# Patient Record
Sex: Male | Born: 1945 | Race: Asian | Hispanic: No | Marital: Married | Smoking: Never smoker
Health system: Southern US, Community
[De-identification: ages and names within clinical notes are randomized; demographics above are authoritative.]

## PROBLEM LIST (undated history)

## (undated) DIAGNOSIS — E162 Hypoglycemia, unspecified: Secondary | ICD-10-CM

## (undated) DIAGNOSIS — H409 Unspecified glaucoma: Secondary | ICD-10-CM

## (undated) DIAGNOSIS — R42 Dizziness and giddiness: Secondary | ICD-10-CM

## (undated) DIAGNOSIS — E785 Hyperlipidemia, unspecified: Secondary | ICD-10-CM

## (undated) DIAGNOSIS — R001 Bradycardia, unspecified: Secondary | ICD-10-CM

## (undated) DIAGNOSIS — I10 Essential (primary) hypertension: Secondary | ICD-10-CM

## (undated) HISTORY — PX: EYE SURGERY: SHX253

## (undated) HISTORY — PX: NO PAST SURGERIES: SHX2092

## (undated) HISTORY — DX: Hypoglycemia, unspecified: E16.2

## (undated) HISTORY — DX: Bradycardia, unspecified: R00.1

## (undated) HISTORY — DX: Dizziness and giddiness: R42

---

## 2002-01-12 ENCOUNTER — Emergency Department (HOSPITAL_COMMUNITY): Admission: EM | Admit: 2002-01-12 | Discharge: 2002-01-12 | Payer: Self-pay | Admitting: Emergency Medicine

## 2002-01-12 ENCOUNTER — Encounter: Payer: Self-pay | Admitting: Emergency Medicine

## 2008-12-19 ENCOUNTER — Emergency Department (HOSPITAL_COMMUNITY): Admission: EM | Admit: 2008-12-19 | Discharge: 2008-12-19 | Payer: Self-pay | Admitting: Emergency Medicine

## 2011-07-31 ENCOUNTER — Inpatient Hospital Stay (INDEPENDENT_AMBULATORY_CARE_PROVIDER_SITE_OTHER)
Admission: RE | Admit: 2011-07-31 | Discharge: 2011-07-31 | Disposition: A | Discharge status: Home / Self Care | Payer: Medicare Other | Source: Ambulatory Visit | Attending: Family Medicine | Admitting: Family Medicine

## 2011-07-31 DIAGNOSIS — T6391XA Toxic effect of contact with unspecified venomous animal, accidental (unintentional), initial encounter: Secondary | ICD-10-CM

## 2013-07-12 ENCOUNTER — Inpatient Hospital Stay (HOSPITAL_COMMUNITY)
Admission: EM | Admit: 2013-07-12 | Discharge: 2013-07-13 | DRG: 316 | Disposition: A | Discharge status: Home / Self Care | Payer: Medicare Other | Attending: Family Medicine | Admitting: Family Medicine

## 2013-07-12 ENCOUNTER — Encounter (HOSPITAL_COMMUNITY): Payer: Self-pay | Admitting: Emergency Medicine

## 2013-07-12 ENCOUNTER — Emergency Department (HOSPITAL_COMMUNITY): Payer: Medicare Other

## 2013-07-12 DIAGNOSIS — I10 Essential (primary) hypertension: Secondary | ICD-10-CM | POA: Diagnosis present

## 2013-07-12 DIAGNOSIS — E785 Hyperlipidemia, unspecified: Secondary | ICD-10-CM | POA: Diagnosis present

## 2013-07-12 DIAGNOSIS — R001 Bradycardia, unspecified: Secondary | ICD-10-CM | POA: Diagnosis present

## 2013-07-12 DIAGNOSIS — I959 Hypotension, unspecified: Principal | ICD-10-CM | POA: Diagnosis present

## 2013-07-12 DIAGNOSIS — H409 Unspecified glaucoma: Secondary | ICD-10-CM | POA: Diagnosis present

## 2013-07-12 DIAGNOSIS — I950 Idiopathic hypotension: Secondary | ICD-10-CM | POA: Diagnosis present

## 2013-07-12 DIAGNOSIS — I498 Other specified cardiac arrhythmias: Secondary | ICD-10-CM | POA: Diagnosis present

## 2013-07-12 HISTORY — DX: Unspecified glaucoma: H40.9

## 2013-07-12 HISTORY — DX: Hyperlipidemia, unspecified: E78.5

## 2013-07-12 HISTORY — DX: Essential (primary) hypertension: I10

## 2013-07-12 LAB — URINALYSIS, ROUTINE W REFLEX MICROSCOPIC
Glucose, UA: NEGATIVE mg/dL
Hgb urine dipstick: NEGATIVE
Leukocytes, UA: NEGATIVE
Specific Gravity, Urine: 1.018 (ref 1.005–1.030)
pH: 6 (ref 5.0–8.0)

## 2013-07-12 LAB — CBC
HCT: 32.5 % — ABNORMAL LOW (ref 39.0–52.0)
HCT: 35.2 % — ABNORMAL LOW (ref 39.0–52.0)
Hemoglobin: 10.9 g/dL — ABNORMAL LOW (ref 13.0–17.0)
MCH: 28.1 pg (ref 26.0–34.0)
MCHC: 33.5 g/dL (ref 30.0–36.0)
MCV: 83.8 fL (ref 78.0–100.0)
MCV: 83.8 fL (ref 78.0–100.0)
Platelets: 126 10*3/uL — ABNORMAL LOW (ref 150–400)
RBC: 3.88 MIL/uL — ABNORMAL LOW (ref 4.22–5.81)
RBC: 4.2 MIL/uL — ABNORMAL LOW (ref 4.22–5.81)
RDW: 12.8 % (ref 11.5–15.5)
RDW: 12.8 % (ref 11.5–15.5)
WBC: 4.2 10*3/uL (ref 4.0–10.5)
WBC: 5.3 10*3/uL (ref 4.0–10.5)

## 2013-07-12 LAB — BASIC METABOLIC PANEL
BUN: 20 mg/dL (ref 6–23)
CO2: 26 mEq/L (ref 19–32)
Calcium: 7.8 mg/dL — ABNORMAL LOW (ref 8.4–10.5)
Chloride: 108 mEq/L (ref 96–112)
Creatinine, Ser: 1.3 mg/dL (ref 0.50–1.35)
GFR calc Af Amer: 63 mL/min — ABNORMAL LOW (ref >90)
GFR calc non Af Amer: 54 mL/min — ABNORMAL LOW (ref >90)
Glucose, Bld: 149 mg/dL — ABNORMAL HIGH (ref 70–99)
Potassium: 4.3 mEq/L (ref 3.5–5.1)
Sodium: 139 mEq/L (ref 135–145)

## 2013-07-12 LAB — POCT I-STAT TROPONIN I: Troponin i, poc: 0.01 ng/mL (ref 0.00–0.08)

## 2013-07-12 LAB — CREATININE, SERUM
GFR calc Af Amer: 71 mL/min — ABNORMAL LOW (ref >90)
GFR calc non Af Amer: 61 mL/min — ABNORMAL LOW (ref >90)

## 2013-07-12 MED ORDER — SODIUM CHLORIDE 0.9 % IV BOLUS (SEPSIS)
1000.0000 mL | Freq: Once | INTRAVENOUS | Status: AC
  Administered 2013-07-12: 1000 mL via INTRAVENOUS

## 2013-07-12 MED ORDER — HEPARIN SODIUM (PORCINE) 5000 UNIT/ML IJ SOLN
5000.0000 [IU] | Freq: Three times a day (TID) | INTRAMUSCULAR | Status: DC
  Filled 2013-07-12 (×5): qty 1

## 2013-07-12 MED ORDER — SODIUM CHLORIDE 0.9 % IV BOLUS (SEPSIS)
500.0000 mL | Freq: Once | INTRAVENOUS | Status: AC
  Administered 2013-07-12: 500 mL via INTRAVENOUS

## 2013-07-12 MED ORDER — SODIUM CHLORIDE 0.9 % IJ SOLN: 3.0000 mL | Freq: Two times a day (BID) | INTRAMUSCULAR | Status: DC

## 2013-07-12 MED ORDER — ATORVASTATIN CALCIUM 40 MG PO TABS
40.0000 mg | ORAL_TABLET | ORAL | Status: DC
  Filled 2013-07-12: qty 1

## 2013-07-12 MED ORDER — SODIUM CHLORIDE 0.9 % IV SOLN
INTRAVENOUS | Status: DC
  Administered 2013-07-12: 100 mL via INTRAVENOUS

## 2013-07-12 MED ORDER — SODIUM CHLORIDE 0.9 % IV BOLUS (SEPSIS): 2000.0000 mL | Freq: Once | INTRAVENOUS | Status: DC

## 2013-07-12 NOTE — ED Notes (Signed)
Brought in by EMS from home, patient's wife states that he has been feeling dizzy off and on for the past 2 wks.  This am his BP was low 70/40 and she called medic Upon arrival EMS got a BP of 68/48

## 2013-07-12 NOTE — H&P (Signed)
FMTS Attending Admission Note: Denny Levy MD [phone] pager office [phone] I  have seen and examined this patient, reviewed their chart. I have discussed this patient with the resident. I agree with the resident's findings, assessment and care plan. His bradycardia is worrisome---especially as he seems to have little chronotropic response to his hypotensin. Interesting that he started to have symptoms about same time as he was placed on eye drops for increased intra-ocular pressure. It is possible that all of this is related to beta blockade from eye drops, but we will do complete work up to include ECHO (rule out cardiomyopathy etc), TSH, cortisol, UDS. His son has some type of heart issue--he is 51 and reports he has had MI; Apparently had some type of surgery (?a cardiac cath?)  at [facility]. I will try to get some more details on that history tomorrow although I doubt it is pertinent to his father's issues.

## 2013-07-12 NOTE — H&P (Signed)
Family Medicine Teaching [facility] Admission History and Physical Service Pager: [phone]  Patient name: Shaun Sharp Medical record number: [id number] Date of birth: [birth year] Age: 67 y.o. Gender: male  Primary Care Provider: No PCP Per Patient  Consultants: none Code Status: Full   Chief Complaint: dizziness, lightheaded  Assessment and Plan: Shaun Sharp is a 67 y.o. year old male presenting with dizziness, found to be hypotensive 70s-80s/40s-50s. PMH is significant for hypertension, hyperlipidemia, glaucoma  # Hypotension/bradycardia: symptomatic, dizziness over past ~3 months, neg orthostatics, somewhat improved w/ 500 cc bolus from 70/49 to 91/54, started when glaucoma eye drops started. Systemic side-effect vs. Cardiomyopathy vs. Cortisol deficiency vs hypothyroidism vs intrinsic cardiac origin vs adrenal insuffiencey  - sinus brady on EKG, telemetry overnight, repeat EKG in AM.  Consider consult to cards pending echo/EKG results - hold home lisinopril, brimonidine, latanoprost, call pt's ophtho office in AM to discuss eyedrops/systemic effects  - discuss possible systemic effects of drops w/optho - give 2L NS bolus, then MIVF @100 .  Will recheck BP in both arms to evaluate for differences  - echo evaluate for possible cardiomyopathy  - rpt EKG - renin/angiotensin ratio to evaluate for possible aldosterone/primary adrenal insufficiency  - am cortisol for adrenal insuffiencey as well as repeat CMET/CBC and TSH -Vitals per unit, up with assistance -UDS to evaluate for secondary causes as well as FOBT -PT/OT consults   # HLD - continue home statin  # Glaucoma: holding meds until discussion with optho  FEN/GI: Regular diet, 2L bolus then MIVF @100  Prophylaxis: SQ heparin  Disposition: pending further work-up and normotension  History of Present Illness: Shaun Sharp is a 67 y.o. year old male presenting with dizziness ongoing for several weeks found to be hypotensive and  bradycardic in the ED.  Pt states this has been ongoing for several weeks now, happening a couple of times per week, but not everyday.  Per wife, she noticed around three months ago, his BP and HR decreased.  Prior to that it was well controlled on medications around 120/80 and HR 65-70.  Due to ongoing concern for hypotension, pt and his wife decided to take his lisinopril down from 20 mg to 10 mg qd around one to two months ago.  However, for the past two days, he continued to be dizzy that was non positional.  Pt's wife does take his BP around two times per day and noted last night it was around 120/80.  Denies chest pain, chest pressure, dyspnea on exertion, edema, PND, orthopnea.  No changes to his medications three months ago other than starting bromonidine eyedrops for his glaucoma.   Pt does state that he does not see a PCP but has [facility] in [facility] past for "a general check up" and at that time started him on Lisinopril.  This visit was around two years ago, and at that time he had a stress test and was started on Crestor 20 mg qod for hyperlipidemia.    Of note, pt has had decreased PO intake for the last two years.   Pt did go from 158 to 130 lbs, unintentional but did have this decreased PO intake.  No fevers, chills, nightsweats, heat or cold intolerance, dysphagia, nausea, vomiting, diarrhea, abdominal pain, dysuria, lightheaded with valsalva, blurred vision/diplopia, tinnitus, vertigo, hearing loss.    He has noticed over the past 4-5 months changes in his urine color to more golden, but otherwise no other changes.  Pt did go [place] the [place]  about 2 weeks ago and denies any bug or animal bites, rashes, trouble at that time.  Denies smoking history, 1-2 glasses of wine per month, illicit drug use, no recent moves, renovations, or changes to the house.  Pt also reports that his 35 yo son had some sort of heart disease recently, thinks he was cathed at [facility] but unclear  why.  Review Of Systems: Per HPI   Patient Active Problem List   Diagnosis Date Noted  . 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 Bradycardia 07/12/2013  . Glaucoma 07/12/2013   Past Medical History: Past Medical History  Diagnosis Date  . Hypertension   . Glaucoma   . Hyperlipidemia    Past Surgical History: History reviewed. No pertinent past surgical history. Social History: History  Substance Use Topics  . Smoking status: Not on file  . Smokeless tobacco: Not on file  . Alcohol Use: No   Additional social history: No smoking or drug use, rare alcohol use (~1 drink/month)  Please also refer to relevant sections of EMR.  Family History: History reviewed. No pertinent family history. Allergies and Medications: Not on File No current facility-administered medications on file prior to encounter.   No current outpatient prescriptions on file prior to encounter.  brimonidine Latanoprost Lisinopril 10mg  rosuvastatin 20mg   Objective: BP 89/54  Pulse 51  Resp 13  SpO2 99% Exam: General: NAD, lying in bed under blankets and coat, looks cold HEENT: mmm, EOMI, PERRL, MMM, no scleral icterus B/L  Cardiovascular: sinus brady, no m/r/g, 2+ dp pulses, regular rhythm  Respiratory: CTAB, NWOB Abdomen: soft, nt, nd, no masses, no HSM Extremities: WWP, no LE edema Skin: no rashes Neuro: CN II-VII intact, 5/5 strength in upper and lower extremities b/l, soft touch sensation normal b/l  Labs and Imaging: CBC BMET   Recent Labs Lab 07/12/13 1357  WBC 4.2  HGB 10.9*  HCT 32.5*  PLT 126*   EKG - Bradycardia, regular rhythm  Recent Labs Lab 07/12/13 1357  NA 139  K 4.3  CL 108  CO2 26  BUN 20  CREATININE 1.30  GLUCOSE 149*  CALCIUM 7.8*     Troponin .01 UA: clean  Beverely Low, MD 07/12/2013, 5:21 PM PGY-1, [facility] Family Medicine FPTS Intern pager: [phone], text pages welcome

## 2013-07-12 NOTE — ED Provider Notes (Signed)
Medical screening examination/treatment/procedure(s) were conducted as a shared visit with non-physician practitioner(s) and myself.  I personally evaluated the patient during the encounter    67 year old male presents with hypotension bradycardia with several episodes of near syncope. On exam the patient has soft abdomen, clear heart and lung sounds, no tachycardia. He does appear mildly fatigued, mucous membranes are moist, no peripheral edema, strong peripheral pulses. Laboratory workup was overall unremarkable, EKG shows a sinus bradycardia without any other significant arrhythmia. The patient will need admission to the hospital for persistent hypotension and bradycardia for further evaluation. He has been clinically stable while in the emergency department without any decompensation despite his slight hypotension  Vida Roller, MD 07/12/13 [phone]

## 2013-07-12 NOTE — ED Provider Notes (Signed)
History    CSN: [id number] Arrival date & time 07/12/13  1324  First MD Initiated Contact with Patient 07/12/13 1326     Chief Complaint  Patient presents with  . Hypotension   (Consider location/radiation/quality/duration/timing/severity/associated sxs/prior Treatment) HPI Comments: 67 year old male with past medical history of hypertension, glaucoma and hyperlipidemia presents to the emergency department with his wife via EMS complaining of hypotension. Per wife patient has been complaining of feeling dizzy over the past 3-4 weeks. This morning patient returned home from running errands around noon, felt dizzy, sat down, symptoms continued for about 45 minutes. Wife took blood pressure and noted it to be 70/49. States similar episode occurred a few weeks back, he drank coffee and felt better. Takes 20mg  lisinopril for HTN, however cut down to 10mg  on his own since he felt he did not need 20mg . Denies any associated symptoms. Denies chest pain, sob, nausea, vomiting, peripheral edema, syncope, fever, chills, palpitations, weakness. Had a full breakfast and lunch today, normal appetite. No PCP. [facility] about 2 years back, had a stress test and everything was normal per patient.  The history is provided by the patient and the spouse.   Past Medical History  Diagnosis Date  . Hypertension   . Glaucoma   . Hyperlipidemia    History reviewed. No pertinent past surgical history. History reviewed. No pertinent family history. History  Substance Use Topics  . Smoking status: Not on file  . Smokeless tobacco: Not on file  . Alcohol Use: No    Review of Systems  Constitutional: Negative for fever, chills, diaphoresis and appetite change.  Eyes: Negative for visual disturbance.  Respiratory: Negative for shortness of breath.   Cardiovascular: Negative for chest pain.  Gastrointestinal: Negative for nausea and vomiting.  Genitourinary: Negative for difficulty  urinating.  Neurological: Positive for dizziness. Negative for syncope, weakness, numbness and headaches.  Psychiatric/Behavioral: Negative for confusion.  All other systems reviewed and are negative.    Allergies  Review of patient's allergies indicates not on file.  Home Medications  No current outpatient prescriptions on file. There were no vitals taken for this visit. Physical Exam  Nursing note and vitals reviewed. Constitutional: He is oriented to person, place, and time. He appears well-developed and well-nourished.  HENT:  Head: Normocephalic and atraumatic.  Mouth/Throat: Uvula is midline and oropharynx is clear and moist.  Eyes: Conjunctivae and EOM are normal. Pupils are equal, round, and reactive to light.  Neck: Normal range of motion. Neck supple. No JVD present.  Cardiovascular: Normal rate, regular rhythm, normal heart sounds and intact distal pulses.   No extremity edema.  Pulmonary/Chest: Effort normal and breath sounds normal. No respiratory distress. He has no wheezes. He has no rales.  Abdominal: Soft. Bowel sounds are normal. He exhibits no distension. There is no tenderness.  Musculoskeletal: Normal range of motion. He exhibits no edema.  Neurological: He is alert and oriented to person, place, and time. He has normal strength. No sensory deficit. Coordination normal.  Skin: Skin is warm and dry. He is not diaphoretic. No pallor.  Psychiatric: He has a normal mood and affect. His behavior is normal.    ED Course  Procedures (including critical care time) Labs Reviewed  CBC - Abnormal; Notable for the following:    RBC 3.88 (*)    Hemoglobin 10.9 (*)    HCT 32.5 (*)    Platelets 126 (*)    All other components within normal limits  BASIC METABOLIC PANEL - Abnormal; Notable for the following:    Glucose, Bld 149 (*)    Calcium 7.8 (*)    GFR calc non Af Amer 54 (*)    GFR calc Af Amer 63 (*)    All other components within normal limits  URINALYSIS,  ROUTINE W REFLEX MICROSCOPIC  POCT I-STAT TROPONIN I    Date: 07/12/2013  Rate: 56  Rhythm: sinus bradycardia  QRS Axis: normal  Intervals: normal  ST/T Wave abnormalities: normal  Conduction Disutrbances:none  Narrative Interpretation:   Old EKG Reviewed: none available   Dg Chest 2 View  07/12/2013   *RADIOLOGY REPORT*  Clinical Data: Hypotension, dizziness  CHEST - 2 VIEW  Comparison: None.  Findings: Cardiomediastinal silhouette is unremarkable.  Mild degenerative changes thoracic spine.  No acute infiltrate or pleural effusion.  No pulmonary edema.  IMPRESSION: No active disease.   Original Report Authenticated By: Natasha Mead, M.D.   1. Hypotension     MDM  Patient with hypotension, dizziness, non-positional. Physical exam unremarkable other than bradycardia. No neuro symptoms or focal findings. BP 85/50. Will give fluid bolus, check labs- cbc, bmp, troponin, ua, CXR. Plan to admit for symptomatic hypotension, vasovagal syncope. 3:50 PM Labs unremarkable other than Hgb of 10.9, no old to compare. EKG normal, CXR clear. Not orthostatic. Patient continues to remain hypotensive and bradycardic. He is well appearing and in NAD. He will be admitted. Case discussed with Dr. Hyacinth Meeker who also evaluated patient and agrees with plan of care. Admission accepted by [facility].  Trevor Mace, PA-C 07/12/13 [phone]

## 2013-07-13 DIAGNOSIS — I495 Sick sinus syndrome: Secondary | ICD-10-CM

## 2013-07-13 LAB — COMPREHENSIVE METABOLIC PANEL
ALT: 13 U/L (ref 0–53)
AST: 16 U/L (ref 0–37)
Alkaline Phosphatase: 65 U/L (ref 39–117)
CO2: 22 mEq/L (ref 19–32)
Calcium: 8.3 mg/dL — ABNORMAL LOW (ref 8.4–10.5)
Potassium: 3.6 mEq/L (ref 3.5–5.1)
Sodium: 140 mEq/L (ref 135–145)
Total Protein: 5.5 g/dL — ABNORMAL LOW (ref 6.0–8.3)

## 2013-07-13 LAB — CBC
MCH: 27.7 pg (ref 26.0–34.0)
MCHC: 33.2 g/dL (ref 30.0–36.0)
Platelets: 129 10*3/uL — ABNORMAL LOW (ref 150–400)
RBC: 4.15 MIL/uL — ABNORMAL LOW (ref 4.22–5.81)

## 2013-07-13 LAB — TSH: TSH: 0.833 u[IU]/mL (ref 0.350–4.500)

## 2013-07-13 NOTE — Care Management Note (Unsigned)
    Page 1 of 1   07/13/2013     2:33:51 PM   CARE MANAGEMENT NOTE 07/13/2013  Patient:  Shaun Sharp, Shaun Sharp   Account Number:  [id number]  Date Initiated:  07/13/2013  Documentation initiated by:  Sherhonda Gaspar  Subjective/Objective Assessment:   PT ADM ON 7/14 WITH HYPOTENSION, DEHYDRATION.  PTA, PT INDEPENDENT, LIVES WITH WIFE AND SON.     Action/Plan:   WILL FOLLOW FOR HOME NEEDS AS PT PROGRESSES.   Anticipated DC Date:  07/15/2013   Anticipated DC Plan:  HOME/SELF CARE      DC Planning Services  CM consult      Choice offered to / List presented to:             Status of service:  In process, will continue to follow Medicare Important Message given?   (If response is "NO", the following Medicare IM given date fields will be blank) Date Medicare IM given:   Date Additional Medicare IM given:    Discharge Disposition:    Per UR Regulation:  Reviewed for med. necessity/level of care/duration of stay  If discussed at Long Length of Stay Meetings, dates discussed:    Comments:

## 2013-07-13 NOTE — Progress Notes (Signed)
FMTS Attending Daily Note: Lue Dubuque MD [phone] pager office [phone] I  have seen and examined this patient, reviewed their chart. I have discussed this patient with the resident. I agree with the resident's findings, assessment and care plan. 

## 2013-07-13 NOTE — Progress Notes (Signed)
Pt refused evening dose of Lipitor and Heparin SQ d/t lack of education. Nurse attempted to educate the patient about the purpose of those medications, however, pt did not feel comfortable consuming. Pt also refused morning dose of heparin d/t reasons stated above. Harmon Pier

## 2013-07-13 NOTE — Progress Notes (Signed)
UR Completed.  [name] Jane [phone] 07/13/2013  

## 2013-07-13 NOTE — Evaluation (Signed)
Physical Therapy Evaluation/ Discharge Patient Details Name: Shaun Sharp MRN: [id number] DOB: 1946-03-02 Today's Date: 07/13/2013 Time: 5621-3086 PT Time Calculation (min): 19 min  PT Assessment / Plan / Recommendation History of Present Illness  Shaun Sharp is a 67 y.o. year old male presenting with dizziness, found to be hypotensive 70s-80s/40s-50s. PMH is significant for hypertension, hyperlipidemia, glaucoma  Clinical Impression  Pt without reproduction of dizziness with head turns, mobility or reaching to the floor. Pt able to visually track without deficit or nystagmus and no reproduction of symptoms with vestibular screening. Pt independent with mobility without further needs at this time. Will sign off, pt aware and agreeable.     PT Assessment  Patent does not need any further PT services    Follow Up Recommendations  No PT follow up    Does the patient have the potential to tolerate intense rehabilitation      Barriers to Discharge        Equipment Recommendations  None recommended by PT    Recommendations for Other Services     Frequency      Precautions / Restrictions Precautions Precautions: None   Pertinent Vitals/Pain No pain BP 153/69, HR 63      Mobility  Bed Mobility Bed Mobility: Supine to Sit Supine to Sit: 7: Independent;HOB flat Transfers Transfers: Sit to Stand;Stand to Sit Sit to Stand: 7: Independent;From bed Stand to Sit: 7: Independent;To chair/3-in-1 Ambulation/Gait Ambulation/Gait Assistance: 7: Independent Ambulation Distance (Feet): 350 Feet Assistive device: None Ambulation/Gait Assistance Details: including head turns, change of direction, change of speed without LOB or dizziness Gait Pattern: Within Functional Limits Gait velocity: WFL Stairs: Yes Stairs Assistance: 7: Independent Stair Management Technique: No rails Number of Stairs: 3    Exercises     PT Diagnosis:    PT Problem List:   PT Treatment Interventions:        PT Goals(Current goals can be found in the care plan section) Acute Rehab PT Goals PT Goal Formulation: No goals set, d/c therapy  Visit Information  Last PT Received On: 07/13/13 Assistance Needed: +1 History of Present Illness: Shaun Sharp is a 67 y.o. year old male presenting with dizziness, found to be hypotensive 70s-80s/40s-50s. PMH is significant for hypertension, hyperlipidemia, glaucoma       Prior Functioning  Home Living Family/patient expects to be discharged to:: Private residence Living Arrangements: Spouse/significant other Available Help at Discharge: Available 24 hours/day Type of Home: House Home Access: Stairs to enter Secretary/administrator of Steps: 1 Home Layout: One level Home Equipment: None Prior Function Level of Independence: Independent Communication Communication: No difficulties    Cognition  Cognition Arousal/Alertness: Awake/alert Behavior During Therapy: WFL for tasks assessed/performed Overall Cognitive Status: Within Functional Limits for tasks assessed    Extremity/Trunk Assessment Upper Extremity Assessment Upper Extremity Assessment: Overall WFL for tasks assessed Lower Extremity Assessment Lower Extremity Assessment: Overall WFL for tasks assessed Cervical / Trunk Assessment Cervical / Trunk Assessment: Normal   Balance    End of Session PT - End of Session Activity Tolerance: Patient tolerated treatment well Patient left: in chair;with call bell/phone within reach;with family/visitor present  GP     Delorse Lek 07/13/2013, 9:36 AM Delaney Meigs, PT [phone]

## 2013-07-13 NOTE — Progress Notes (Signed)
  Echocardiogram 2D Echocardiogram has been performed.  Arvil Chaco 07/13/2013, 8:27 AM

## 2013-07-13 NOTE — Progress Notes (Signed)
Patient is ready for discharge.  Discharge instructions reviewed with the patient and family. Follow up appointments reviewed with patient family. All voice understanding to teaching.  Patient given copies of his D/C instructions.  Ambulatory to door. Home via POV with his daughter driving.

## 2013-07-13 NOTE — Progress Notes (Signed)
Family Medicine Teaching Service Daily Progress Note Intern Pager: [phone]  Patient name: Shaun Sharp Medical record number: [id number] Date of birth: 03/20/1946 Age: 67 y.o. Gender: male  Primary Care Provider: No PCP Per Patient Consultants: None Code Status: Full  Pt Overview and Major Events to Date:   Assessment and Plan: Shaun Sharp is a 67 y.o. year old male presenting with dizziness, found to be hypotensive 70s-80s/40s-50s. PMH is significant for hypertension, hyperlipidemia, glaucoma   # Hypotension/bradycardia: symptomatic, dizziness over past ~3 months, neg orthostatics, somewhat improved w/ 500 cc bolus from 70/49 to 91/54, started when glaucoma eye drops started. Systemic side-effect vs. Cardiomyopathy vs. Cortisol deficiency vs hypothyroidism vs intrinsic cardiac origin vs adrenal insuffiencey  - sinus brady on EKG, telemetry overnight, repeat EKG in AM. Consider consult to cards pending echo/EKG results  - hold home lisinopril, brimonidine, latanoprost, call pt's ophtho office in AM to discuss eyedrops/systemic effects  - discuss possible systemic effects of drops w/optho  - give 2L NS bolus, then MIVF @100 . Will recheck BP in both arms to evaluate for differences  - echo evaluate for possible cardiomyopathy  - rpt EKG  - renin/angiotensin ratio to evaluate for possible aldosterone/primary adrenal insufficiency  - am cortisol for adrenal insuffiencey as well as repeat CMET/CBC and TSH  -Vitals per unit, up with assistance  -UDS to evaluate for secondary causes as well as FOBT  -PT/OT consults   # HLD  - continue home statin  # Glaucoma: holding meds until discussion with optho  FEN/GI: Regular diet, 2L bolus then MIVF @100   Prophylaxis: SQ heparin   Disposition: Home   Subjective: feeling good overall. No SOB, headache, lightheadedness, palpitations.   Objective: Temp:  [97.7 F (36.5 C)-98.6 F (37 C)] 97.7 F (36.5 C) (07/15 0428) Pulse Rate:  [49-66] 64  (07/15 0428) Resp:  [12-18] 18 (07/15 0428) BP: (79-123)/(48-71) 120/67 mmHg (07/15 0428) SpO2:  [97 %-100 %] 97 % (07/15 0428) Weight:  [148 lb 2.4 oz (67.2 kg)] 148 lb 2.4 oz (67.2 kg) (07/14 1813) Physical Exam: General: No acute distress. Laying in bed comfortably Cardiovascular: RRR, no murmurs Respiratory: CTAB, no wheeze Abdomen: Soft, non-tender, non-distended Extremities: No edema  Laboratory:  Recent Labs Lab 07/12/13 1357 07/12/13 1855 07/13/13 0426  WBC 4.2 5.3 4.5  HGB 10.9* 11.7* 11.5*  HCT 32.5* 35.2* 34.6*  PLT 126* 141* 129*    Recent Labs Lab 07/12/13 1357 07/12/13 1855 07/13/13 0426  NA 139  --  140  K 4.3  --  3.6  CL 108  --  111  CO2 26  --  22  BUN 20  --  16  CREATININE 1.30 1.18 1.22  CALCIUM 7.8*  --  8.3*  PROT  --   --  5.5*  BILITOT  --   --  0.3  ALKPHOS  --   --  65  ALT  --   --  13  AST  --   --  16  GLUCOSE 149*  --  112*     Jacquelin Hawking, MD 07/13/2013, 7:29 AM PGY-1, [facility] Family Medicine FPTS Intern pager: [phone], text pages welcome

## 2013-07-13 NOTE — Progress Notes (Signed)
OT Cancellation Note  Patient Details Name: Shaun Sharp MRN: [id number] DOB: 1946/08/20   Cancelled Treatment:    Reason Eval/Treat Not Completed: OT screened, no needs identified, will sign off  Marni Franzoni,HILLARY 07/13/2013, 4:41 PM

## 2013-07-15 NOTE — Discharge Summary (Signed)
Family Medicine Teaching [facility] Discharge Summary  Patient name: Shaun Sharp Medical record number: [id number] Date of birth: 1946-12-02 Age: 67 y.o. Gender: male Date of Admission: 07/12/2013  Date of Discharge: 07/13/2013  Admitting Physician: Nestor Ramp, MD  Primary Care Provider: No PCP Per Patient Consultants: None  Indication for Hospitalization: dizziness and lightheadedness  Discharge Diagnoses/Problem List:  1. Hypotension/bradycardia 2. Hyperlipidemia 3. Glaucoma  Disposition: Home  Discharge Condition: Stable  Brief Hospital Course: Shaun Sharp presenting with dizziness ongoing for several weeks found to be hypotensive and bradycardic in the ED. He stated this had been ongoing for several weeks now, happening a couple of times per week, but not everyday. Per wife, she noticed around three months ago, his BP and HR decreased. Prior to that it was well controlled on medications around 120/80 and HR 65-70. Due to ongoing concern for hypotension, pt and his wife decided to take his lisinopril down from 20 mg to 10 mg qd around one to two months ago. However, for the past two days, he continued to be dizzy that was non positional. Pt's wife does take his BP around two times per day and noted last night it was around 120/80. Denies chest pain, chest pressure, dyspnea on exertion, edema, PND, orthopnea. No changes to his medications three months ago other than starting bromonidine eyedrops for his glaucoma.  1. Hypotension/bradycardia - Shaun Sharp was placed on telemetry. EKG showed bradycardia with a regular rhythm. TSH was 0.833. Urinalysis was negative. He received 1500L NS bolus. Cortisol was negative. Renin/angiotension and cortisol pending. Over night, his blood pressure increased to 120s-150s/60s-70s. His pulse increased to 60s-70s. His symptoms of lightheadedness resolved.  2. Hyperlipidemia - we started Shaun Sharp on lipitor, however, he declined to take the meds  3.  Glaucoma - we discontinued Shaun Sharp's brimonidine and latanaprost.   Issues for Follow Up: 1. BP control 2. Restarting regimen for glaucoma 3. Results of echocardiogram  Significant Procedures: None  Significant Labs and Imaging:   Recent Labs Lab 07/12/13 1357 07/12/13 1855 07/13/13 0426  WBC 4.2 5.3 4.5  HGB 10.9* 11.7* 11.5*  HCT 32.5* 35.2* 34.6*  PLT 126* 141* 129*    Recent Labs Lab 07/12/13 1357 07/12/13 1855 07/13/13 0426  NA 139  --  140  K 4.3  --  3.6  CL 108  --  111  CO2 26  --  22  GLUCOSE 149*  --  112*  BUN 20  --  16  CREATININE 1.30 1.18 1.22  CALCIUM 7.8*  --  8.3*  ALKPHOS  --   --  65  AST  --   --  16  ALT  --   --  13  ALBUMIN  --   --  2.9*     07/12/2013 18:55  TSH 0.833    07/12/2013 14:03 07/12/2013 18:55  Troponin I  <0.30  Troponin i, poc 0.01     Outstanding Results:  Renin/angiotensin AM Cortisol  Discharge Medications:    Medication List    STOP taking these medications       brimonidine 0.2 % ophthalmic solution  Commonly known as:  ALPHAGAN     latanoprost 0.005 % ophthalmic solution  Commonly known as:  XALATAN     lisinopril 10 MG tablet  Commonly known as:  PRINIVIL,ZESTRIL      TAKE these medications       rosuvastatin 20 MG tablet  Commonly known as:  CRESTOR  Take  20 mg by mouth every other day.        Discharge Instructions: Please refer to Patient Instructions section of EMR for full details.  Patient was counseled important signs and symptoms that should prompt return to medical care, changes in medications, dietary instructions, activity restrictions, and follow up appointments.   Follow-Up Appointments:     Follow-up Information   Follow up with [facility]). (at 3:30pm to follow up your eye pressures and talk about restarting eye drops)    Contact information:   [address] [place] [address] [phone]      Call Jacquelin Hawking, MD. (Call tomorrow  morning to make an appointment for one week)    Contact information:   [address] [place] [address] [phone]       Jacquelin Hawking, MD 07/15/2013, 9:27 PM PGY-1, [facility]

## 2013-07-16 NOTE — Discharge Summary (Signed)
Family Medicine Teaching Service  Discharge Note : Attending Julitza Rickles MD Pager [phone] Office [phone] I have seen and examined this patient, reviewed their chart and discussed discharge planning wit the resident at the time of discharge. I agree with the discharge plan as above.  

## 2013-07-21 LAB — ALDOSTERONE + RENIN ACTIVITY W/ RATIO
ALDO / PRA Ratio: 1.1 Ratio (ref 0.9–28.9)
PRA LC/MS/MS: 1.79 ng/mL/h (ref 0.25–5.82)

## 2013-07-27 ENCOUNTER — Inpatient Hospital Stay: Payer: Medicare Other | Admitting: Family Medicine

## 2013-08-05 ENCOUNTER — Ambulatory Visit: Payer: Medicare Other | Admitting: Family Medicine

## 2013-08-20 ENCOUNTER — Ambulatory Visit (INDEPENDENT_AMBULATORY_CARE_PROVIDER_SITE_OTHER): Payer: Medicare Other | Admitting: Cardiovascular Disease

## 2013-08-20 ENCOUNTER — Encounter: Payer: Self-pay | Admitting: Cardiovascular Disease

## 2013-08-20 VITALS — BP 114/70 | HR 55 | Ht 63.0 in | Wt 146.4 lb

## 2013-08-20 DIAGNOSIS — R001 Bradycardia, unspecified: Secondary | ICD-10-CM

## 2013-08-20 DIAGNOSIS — I959 Hypotension, unspecified: Secondary | ICD-10-CM

## 2013-08-20 DIAGNOSIS — I950 Idiopathic hypotension: Secondary | ICD-10-CM

## 2013-08-20 DIAGNOSIS — I498 Other specified cardiac arrhythmias: Secondary | ICD-10-CM

## 2013-08-20 DIAGNOSIS — E785 Hyperlipidemia, unspecified: Secondary | ICD-10-CM

## 2013-08-20 NOTE — Assessment & Plan Note (Signed)
Patient was admitted by the family medicine teaching service in July of this year because of hypotension and bradycardia. This had been going on for several weeks. The patient was on antihypertensive medications and was taking alpha-2 blocker eyedrops for glaucoma. These were discontinued and his symptoms have markedly improved.

## 2013-08-20 NOTE — Patient Instructions (Addendum)
Follow up with Dr Berry only as needed. 

## 2013-08-20 NOTE — Assessment & Plan Note (Signed)
It was suggested that he start a statin drug however the patient declined during his hospitalization

## 2013-08-20 NOTE — Progress Notes (Signed)
   08/20/2013 Shaun Sharp   09/17/1946  [id number]  Primary Physician Shaun Garibaldi, NP Primary Cardiologist: Shaun Gess MD Shaun Sharp   HPI:  [name] is a 67 year old thin appearing married Asian male father of 4 children. I take care of his wife and all his children as well. He was self referred for bradycardia hypotension and weakness. He was initially hospitalized on the telemetry medicine teaching service in July for the symptoms. His glaucoma eye drops containing off agonists were discontinued and he feels clinically improved. He has no cardiac risk factors other than 2 hypertension and hyperlipidemia. He does not smoke. There is no family history. He denies chest pain or shortness of breath.   Current Outpatient Prescriptions  Medication Sig Dispense Refill  . lisinopril (PRINIVIL,ZESTRIL) 20 MG tablet Take 10 mg by mouth daily.      . pravastatin (PRAVACHOL) 10 MG tablet Take 10 mg by mouth daily.       No current facility-administered medications for this visit.    Not on File  History   Social History  . Marital Status: Married    Spouse Name: N/A    Number of Children: N/A  . Years of Education: N/A   Occupational History  . Not on file.   Social History Main Topics  . Smoking status: Never Smoker   . Smokeless tobacco: Not on file  . Alcohol Use: No  . Drug Use: Not on file  . Sexual Activity: Not on file   Other Topics Concern  . Not on file   Social History Narrative  . No narrative on file     Review of Systems: General: negative for chills, fever, night sweats or weight changes.  Cardiovascular: negative for chest pain, dyspnea on exertion, edema, orthopnea, palpitations, paroxysmal nocturnal dyspnea or shortness of breath Dermatological: negative for rash Respiratory: negative for cough or wheezing Urologic: negative for hematuria Abdominal: negative for nausea, vomiting, diarrhea, bright red blood per rectum, melena, or  hematemesis Neurologic: negative for visual changes, syncope, or dizziness All other systems reviewed and are otherwise negative except as noted above.    Blood pressure 114/70, pulse 55, height 5\' 3"  (1.6 m), weight 146 lb 6.4 oz (66.407 kg).  General appearance: alert and no distress Neck: no adenopathy, no carotid bruit, no JVD, supple, symmetrical, trachea midline and thyroid not enlarged, symmetric, no tenderness/mass/nodules Lungs: clear to auscultation bilaterally Heart: regular rate and rhythm, S1, S2 normal, no murmur, click, rub or gallop Abdomen: soft, non-tender; bowel sounds normal; no masses,  no organomegaly Extremities: extremities normal, atraumatic, no cyanosis or edema Pulses: 2+ and symmetric  EKG sinus bradycardia at 55 without ST or T wave changes  ASSESSMENT AND PLAN:   Idiopathic hypotension Patient was admitted by the family medicine teaching service in July of this year because of hypotension and bradycardia. This had been going on for several weeks. The patient was on antihypertensive medications and was taking alpha-2 blocker eyedrops for glaucoma. These were discontinued and his symptoms have markedly improved.  Hyperlipidemia It was suggested that he start a statin drug however the patient declined during his hospitalization      Shaun Gess MD [facility], [facility] 08/20/2013 11:39 AM

## 2013-11-04 ENCOUNTER — Other Ambulatory Visit: Payer: Self-pay

## 2017-10-06 ENCOUNTER — Telehealth: Payer: Self-pay | Admitting: Cardiovascular Disease

## 2017-10-06 NOTE — Telephone Encounter (Signed)
Received records from [facility] for appointment on 10/15/17 with Dr Allyson Sabal.  Records put with Dr Hazle Coca schedule for 10/15/17. lp

## 2017-10-15 ENCOUNTER — Ambulatory Visit (INDEPENDENT_AMBULATORY_CARE_PROVIDER_SITE_OTHER): Payer: Medicare Other | Admitting: Cardiovascular Disease

## 2017-10-15 ENCOUNTER — Encounter: Payer: Self-pay | Admitting: Cardiovascular Disease

## 2017-10-15 DIAGNOSIS — E7800 Pure hypercholesterolemia, unspecified: Secondary | ICD-10-CM

## 2017-10-15 DIAGNOSIS — R42 Dizziness and giddiness: Secondary | ICD-10-CM | POA: Diagnosis not present

## 2017-10-15 NOTE — Assessment & Plan Note (Signed)
History of hyperlipidemia on statin therapy followed by his PCP 

## 2017-10-15 NOTE — Patient Instructions (Signed)
Medication Instructions: Your physician recommends that you continue on your current medications as directed. Please refer to the Current Medication list given to you today.  Labwork: Your physician recommends that you return for lab work: TSH, Free, T4  Testing/Procedures: Your physician has recommended that you wear a 30 day event monitor. Event monitors are medical devices that record the heart's electrical activity. Doctors most often us these monitors to diagnose arrhythmias. Arrhythmias are problems with the speed or rhythm of the heartbeat. The monitor is a small, portable device. You can wear one while you do your normal daily activities. This is usually used to diagnose what is causing palpitations/syncope (passing out).  Follow-Up: Your physician recommends that you schedule a follow-up appointment in: 2 months with Dr. Allyson SabalBerry.  If you need a refill on your cardiac medications before your next appointment, please call your pharmacy.

## 2017-10-15 NOTE — Progress Notes (Signed)
10/15/2017 Shaun Sharp   [birth year]  [id number]  Primary Physician Marva PandaMillsaps, Kimberly, NP Primary Cardiologist: Runell GessJonathan J Clydine Parkison MD Roseanne RenoFACP, FACC, FAHA, FSCAI  HPI:  Sallyanne HaversBroi Sportsman is a 71 y.o. male thin appearing married Asian male father of 4 children. I take care of his wife and all his children as well. He was self referred for bradycardia hypotension and weakness. I last saw him in the office 08/20/13. He was initially hospitalized on the telemetry medicine teaching service in July for the symptoms. His glaucoma eye drops containing off agonists were discontinued and he feels clinically improved. He has no cardiac risk factors other than 2 hypertension and hyperlipidemia. He does not smoke. There is no family history. He denies chest pain or shortness of breath. Over the last several months he's had new onset episodes of dizziness/presyncope. He's had 4-5 episodes.    Current Meds  Medication Sig  . lisinopril (PRINIVIL,ZESTRIL) 20 MG tablet Take 10 mg by mouth daily.  . pravastatin (PRAVACHOL) 10 MG tablet Take 10 mg by mouth daily.  . travoprost, benzalkonium, (TRAVATAN) 0.004 % ophthalmic solution Place 1 drop into both eyes daily.     Not on File  Social History   Social History  . Marital status: Married    Spouse name: N/A  . Number of children: N/A  . Years of education: N/A   Occupational History  . Not on file.   Social History Main Topics  . Smoking status: Never Smoker  . Smokeless tobacco: Never Used  . Alcohol use No  . Drug use: Unknown  . Sexual activity: Not on file   Other Topics Concern  . Not on file   Social History Narrative  . No narrative on file     Review of Systems: General: negative for chills, fever, night sweats or weight changes.  Cardiovascular: negative for chest pain, dyspnea on exertion, edema, orthopnea, palpitations, paroxysmal nocturnal dyspnea or shortness of breath Dermatological: negative for rash Respiratory: negative for  cough or wheezing Urologic: negative for hematuria Abdominal: negative for nausea, vomiting, diarrhea, bright red blood per rectum, melena, or hematemesis Neurologic: negative for visual changes, syncope, or dizziness All other systems reviewed and are otherwise negative except as noted above.    Blood pressure (!) 118/58, pulse 60, height 5\' 5"  (1.651 m), weight 140 lb 9.6 oz (63.8 kg).  General appearance: alert and no distress Neck: no adenopathy, no carotid bruit, no JVD, supple, symmetrical, trachea midline and thyroid not enlarged, symmetric, no tenderness/mass/nodules Lungs: clear to auscultation bilaterally Heart: regular rate and rhythm, S1, S2 normal, no murmur, click, rub or gallop Extremities: extremities normal, atraumatic, no cyanosis or edema Pulses: 2+ and symmetric Skin: Skin color, texture, turgor normal. No rashes or lesions Neurologic: Alert and oriented X 3, normal strength and tone. Normal symmetric reflexes. Normal coordination and gait  EKG sinus rhythm at 60 without ST or T-wave changes. There were septal Q waves noted. I personally reviewed this EKG.  ASSESSMENT AND PLAN:   Hyperlipidemia History of hyperlipidemia on statin therapy followed by his PCP  Dizziness Mr. Alberty  was referred back for episodes of recurrent dizziness. I last saw him 4 years ago. He was having similar episodes of bradycardia and hypotension with weakness. This was thought potentially to be due to eyedrops which he was taking for glaucoma that had beta antagonists. These were discontinued and his symptoms improved. I'm going to get thyroid function tests on him and obtain a 30  day event monitor.      Runell Gess MD FACP,FACC,FAHA, [facility] 10/15/2017 12:19 PM

## 2017-10-15 NOTE — Addendum Note (Signed)
Addended by: Evans LanceSTOVER, Tayia Stonesifer W on: 10/15/2017 12:24 PM   Modules accepted: Orders

## 2017-10-15 NOTE — Addendum Note (Signed)
Addended by: Chana BodeGREEN, Michaelia Beilfuss L on: 10/15/2017 02:04 PM   Modules accepted: Orders

## 2017-10-15 NOTE — Assessment & Plan Note (Signed)
Shaun Sharp  was referred back for episodes of recurrent dizziness. I last saw him 4 years ago. He was having similar episodes of bradycardia and hypotension with weakness. This was thought potentially to be due to eyedrops which he was taking for glaucoma that had beta antagonists. These were discontinued and his symptoms improved. I'm going to get thyroid function tests on him and obtain a 30 day event monitor.

## 2017-10-16 LAB — T4, FREE: Free T4: 1.47 ng/dL (ref 0.82–1.77)

## 2017-10-16 LAB — TSH: TSH: 1.6 u[IU]/mL (ref 0.450–4.500)

## 2017-10-29 ENCOUNTER — Ambulatory Visit (INDEPENDENT_AMBULATORY_CARE_PROVIDER_SITE_OTHER): Payer: Medicare Other

## 2017-10-29 DIAGNOSIS — R42 Dizziness and giddiness: Secondary | ICD-10-CM | POA: Diagnosis not present

## 2017-12-19 ENCOUNTER — Ambulatory Visit: Payer: Medicare Other | Admitting: Cardiovascular Disease

## 2017-12-19 ENCOUNTER — Encounter: Payer: Self-pay | Admitting: Cardiovascular Disease

## 2017-12-19 DIAGNOSIS — R001 Bradycardia, unspecified: Secondary | ICD-10-CM

## 2017-12-19 NOTE — Patient Instructions (Signed)

## 2017-12-19 NOTE — Assessment & Plan Note (Signed)
Shaun Sharp returns here for follow-up of his event monitor which shows sinus rhythm, sinus bradycardia in the 50s and one episode of a heart rate of 37. Since I saw him 2 months ago he's been complaining symptomatic denying dizziness chest pain or shortness of breath.

## 2017-12-19 NOTE — Progress Notes (Signed)
[name] returns here for follow-up of his event monitor which shows sinus rhythm, sinus bradycardia in the 50s and one episode of a heart rate of 37. Since I saw him 2 months ago he's been complaining symptomatic denying dizziness chest pain or shortness of breath.  Runell GessJonathan J. Jackey Housey, M.D., FACP, [facility]ACC, Earl LagosFAHA, [facility]SC[facility] [address] [place], [place]  [id number]  [phone] 12/19/2017 10:25 AM

## 2018-10-08 ENCOUNTER — Encounter: Payer: Self-pay | Admitting: Gastroenterology

## 2018-10-08 ENCOUNTER — Ambulatory Visit: Payer: Medicare Other | Admitting: Gastroenterology

## 2018-10-08 VITALS — BP 138/64 | HR 68 | Ht 63.39 in | Wt 144.5 lb

## 2018-10-08 DIAGNOSIS — Z1212 Encounter for screening for malignant neoplasm of rectum: Secondary | ICD-10-CM

## 2018-10-08 DIAGNOSIS — Z1211 Encounter for screening for malignant neoplasm of colon: Secondary | ICD-10-CM | POA: Diagnosis not present

## 2018-10-08 DIAGNOSIS — K642 Third degree hemorrhoids: Secondary | ICD-10-CM

## 2018-10-08 NOTE — Patient Instructions (Signed)
It has been recommended to you by your physician that you have a(n) Colonoscopy and referral to a colorectal surgeon completed. Per your request, we did not schedule the procedure(s) or referral today. Please contact our office at [phone] should you decide to have the procedure completed.  Thank you for choosing me and [facility].  Venita Lick. Pleas Koch., MD., Clementeen Graham

## 2018-10-08 NOTE — Progress Notes (Signed)
History of Present Illness: This is a 72 year old male self referred for the evaluation of prolapsing hemorrhoids.  He relates difficulties with hemorrhoids for many years.  He relates prolapsing hemorrhoids that spontaneously reduced for several years however over the past year he has had to manually reduce his hemorrhoids after each bowel movement.  He is tried several hemorrhoidal creams.  He occasionally notes anal pain and small amounts of rectal bleeding.  He has not previously had colonoscopy. Denies weight loss, abdominal pain, constipation, diarrhea, change in stool caliber, melena, nausea, vomiting, dysphagia, reflux symptoms, chest pain.   No Known Allergies Outpatient Medications Prior to Visit  Medication Sig Dispense Refill  . lisinopril (PRINIVIL,ZESTRIL) 5 MG tablet Take 5 mg by mouth daily.    . pravastatin (PRAVACHOL) 10 MG tablet Take 10 mg by mouth daily.    . travoprost, benzalkonium, (TRAVATAN) 0.004 % ophthalmic solution Place 1 drop into both eyes daily.     No facility-administered medications prior to visit.    Past Medical History:  Diagnosis Date  . Bradycardia   . Glaucoma   . Hyperlipidemia   . Hypertension   . Hypoglycemia   . Lightheadedness    Past Surgical History:  Procedure Laterality Date  . NO PAST SURGERIES     Social History   Socioeconomic History  . Marital status: Married    Spouse name: Not on file  . Number of children: 4  . Years of education: Not on file  . Highest education level: Not on file  Occupational History  . Occupation: self employed  Social Needs  . Financial resource strain: Not on file  . Food insecurity:    Worry: Not on file    Inability: Not on file  . Transportation needs:    Medical: Not on file    Non-medical: Not on file  Tobacco Use  . Smoking status: Never Smoker  . Smokeless tobacco: Never Used  Substance and Sexual Activity  . Alcohol use: Yes    Comment: occasional beer once a year  . Drug  use: Not on file  . Sexual activity: Not on file  Lifestyle  . Physical activity:    Days per week: Not on file    Minutes per session: Not on file  . Stress: Not on file  Relationships  . Social connections:    Talks on phone: Not on file    Gets together: Not on file    Attends religious service: Not on file    Active member of club or organization: Not on file    Attends meetings of clubs or organizations: Not on file    Relationship status: Not on file  Other Topics Concern  . Not on file  Social History Narrative  . Not on file   Family History  Problem Relation Age of Onset  . Cataracts Mother        Review of Systems: Pertinent positive and negative review of systems were noted in the above HPI section. All other review of systems were otherwise negative.   Physical Exam: General: Well developed, well nourished, no acute distress Head: Normocephalic and atraumatic Eyes:  sclerae anicteric, EOMI Ears: Normal auditory acuity Mouth: No deformity or lesions Neck: Supple, no masses or thyromegaly Lungs: Clear throughout to auscultation Heart: Regular rate and rhythm; no murmurs, rubs or bruits Abdomen: Soft, non tender and non distended. No masses, hepatosplenomegaly or hernias noted. Normal Bowel sounds Rectal: No lesions, no tenderness,  good sphincter tone, good squeeze, Hemoccult negative stool Musculoskeletal: Symmetrical with no gross deformities  Skin: No lesions on visible extremities Pulses:  Normal pulses noted Extremities: No clubbing, cyanosis, edema or deformities noted Neurological: Alert oriented x 4, grossly nonfocal Cervical Nodes:  No significant cervical adenopathy Inguinal Nodes: No significant inguinal adenopathy Psychological:  Alert and cooperative. Normal mood and affect   Assessment and Recommendations:  1.  Suspected grade 3 internal hemorrhoids.  Recommended proceeding with colonoscopy for further evaluation of his symptoms and for  colorectal cancer screening.  Recommended surgical referral for further evaluation and management of suspected hemorrhoids as he is not any ideal candidate for banding.  He had a number of questions and I addressed them to his satisfaction.  He states he will consider both of these recommendations and contact us if he wishes to proceed.  GI follow-up PRN.  Ongoing follow-up with his PCP.   cc: Nadyne Coombes, MD

## 2019-01-01 DIAGNOSIS — Z23 Encounter for immunization: Secondary | ICD-10-CM | POA: Diagnosis not present

## 2019-01-04 DIAGNOSIS — I10 Essential (primary) hypertension: Secondary | ICD-10-CM | POA: Diagnosis not present

## 2019-01-04 DIAGNOSIS — J101 Influenza due to other identified influenza virus with other respiratory manifestations: Secondary | ICD-10-CM | POA: Diagnosis not present

## 2019-03-26 DIAGNOSIS — I491 Atrial premature depolarization: Secondary | ICD-10-CM | POA: Diagnosis not present

## 2019-03-26 DIAGNOSIS — Z125 Encounter for screening for malignant neoplasm of prostate: Secondary | ICD-10-CM | POA: Diagnosis not present

## 2019-03-26 DIAGNOSIS — E119 Type 2 diabetes mellitus without complications: Secondary | ICD-10-CM | POA: Diagnosis not present

## 2019-03-26 DIAGNOSIS — E785 Hyperlipidemia, unspecified: Secondary | ICD-10-CM | POA: Diagnosis not present

## 2019-03-26 DIAGNOSIS — Z0000 Encounter for general adult medical examination without abnormal findings: Secondary | ICD-10-CM | POA: Diagnosis not present

## 2019-03-26 DIAGNOSIS — R05 Cough: Secondary | ICD-10-CM | POA: Diagnosis not present

## 2019-03-26 DIAGNOSIS — I10 Essential (primary) hypertension: Secondary | ICD-10-CM | POA: Diagnosis not present

## 2019-03-26 DIAGNOSIS — M519 Unspecified thoracic, thoracolumbar and lumbosacral intervertebral disc disorder: Secondary | ICD-10-CM | POA: Diagnosis not present

## 2019-05-11 ENCOUNTER — Telehealth: Payer: Self-pay | Admitting: Cardiovascular Disease

## 2019-05-13 ENCOUNTER — Telehealth (INDEPENDENT_AMBULATORY_CARE_PROVIDER_SITE_OTHER): Payer: PPO | Admitting: Cardiovascular Disease

## 2019-05-13 DIAGNOSIS — E782 Mixed hyperlipidemia: Secondary | ICD-10-CM

## 2019-05-13 NOTE — Patient Instructions (Signed)
Medication Instructions:   Your physician recommends that you continue on your current medications as directed. Please refer to the Current Medication list given to you today. If you need a refill on your cardiac medications before your next appointment, please call your pharmacy.   Lab work:  NONE  Testing/Procedures:  NONE  Follow-Up: Your physician recommends that you schedule a follow-up appointment in: 1 year. You will receive a reminder letter in the mail in about 10 months reminding you to call and schedule your appointment. If you don't receive this letter, please contact our office.  Any Other Special Instructions Will Be Listed Below (If Applicable).

## 2019-05-13 NOTE — Progress Notes (Signed)
Virtual Visit via Video Note   This visit type was conducted due to national recommendations for restrictions regarding the COVID-19 Pandemic (e.g. social distancing) in an effort to limit this patient's exposure and mitigate transmission in our community.  Due to his co-morbid illnesses, this patient is at least at moderate risk for complications without adequate follow up.  This format is felt to be most appropriate for this patient at this time.  All issues noted in this document were discussed and addressed.  A limited physical exam was performed with this format.  Please refer to the patient's chart for his consent to telehealth for [facility].   Date:  05/13/2019   ID:  Shaun, Sharp 01/01/1946, MRN [id number]  Patient Location: Home Provider Location: Home  PCP:  Dois Davenport, MD  Cardiologist:  Nanetta Batty, MD  Electrophysiologist:  None   Evaluation Performed:  Follow-Up Visit  Chief Complaint: 1 year follow-up hypertension hyperlipidemia  History of Present Illness:    Shaun Sharp is a 73 y.o. male thin appearing married Asian male father of 4 children. I take care of his wife and all his children as well. He was self referred for bradycardia hypotension and weakness. I last saw him in the office 12/19/2017. He was initially hospitalized on the telemetry medicine teaching service in July for the symptoms. His glaucoma eye drops containing off agonists were discontinued and he feels clinically improved. He has no cardiac risk factors other than 2 hypertension and hyperlipidemia. He does not smoke. There is no family history.  Since I saw him a year and a half ago he is remained stable.  He denies chest pain or shortness of breath.  The patient does not have symptoms concerning for COVID-19 infection (fever, chills, cough, or new shortness of breath).    Past Medical History:  Diagnosis Date  . Bradycardia   . Glaucoma   . Hyperlipidemia   . Hypertension   .  Hypoglycemia   . Lightheadedness    Past Surgical History:  Procedure Laterality Date  . NO PAST SURGERIES       Current Meds  Medication Sig  . lisinopril (PRINIVIL,ZESTRIL) 5 MG tablet Take 5 mg by mouth daily.  . pravastatin (PRAVACHOL) 10 MG tablet Take 10 mg by mouth daily.     Allergies:   Patient has no known allergies.   Social History   Tobacco Use  . Smoking status: Never Smoker  . Smokeless tobacco: Never Used  Substance Use Topics  . Alcohol use: Yes    Comment: occasional beer once a year  . Drug use: Not on file     Family Hx: The patient's family history includes Cataracts in his mother.  ROS:   Please see the history of present illness.     All other systems reviewed and are negative.   Prior CV studies:   The following studies were reviewed today:  None  Labs/Other Tests and Data Reviewed:    EKG:  No ECG reviewed.  Recent Labs: No results found for requested labs within last 8760 hours.   Recent Lipid Panel No results found for: CHOL, TRIG, HDL, CHOLHDL, LDLCALC, LDLDIRECT  Wt Readings from Last 3 Encounters:  05/13/19 139 lb (63 kg)  10/08/18 144 lb 8 oz (65.5 kg)  12/19/17 141 lb 6.4 oz (64.1 kg)     Objective:    Vital Signs:  BP 125/69   Pulse 68   Ht 5\' 3"  (1.6 m)  Wt 139 lb (63 kg)   SpO2 98%   BMI 24.62 kg/m    VITAL SIGNS:  reviewed GEN:  no acute distress RESPIRATORY:  normal respiratory effort, symmetric expansion NEURO:  alert and oriented x 3, no obvious focal deficit PSYCH:  normal affect  ASSESSMENT & PLAN:    1. Hyperlipidemia- history of hyperlipidemia on pravastatin with recent lipid profile performed 3/20 revealed by his PCP revealing total cholesterol 196, LDL of 106.  COVID-19 Education: The signs and symptoms of COVID-19 were discussed with the patient and how to seek care for testing (follow up with PCP or arrange E-visit).  The importance of social distancing was discussed today.  Time:   Today,  I have spent 5 minutes with the patient with telehealth technology discussing the above problems.     Medication Adjustments/Labs and Tests Ordered: Current medicines are reviewed at length with the patient today.  Concerns regarding medicines are outlined above.   Tests Ordered: No orders of the defined types were placed in this encounter.   Medication Changes: No orders of the defined types were placed in this encounter.   Disposition:  Follow up in 1 year(s)  Signed, Nanetta BattyJonathan Jacoba Cherney, MD  05/13/2019 9:53 AM     [facility]

## 2019-06-29 NOTE — Telephone Encounter (Signed)
Opened in error

## 2019-07-06 DIAGNOSIS — E785 Hyperlipidemia, unspecified: Secondary | ICD-10-CM | POA: Diagnosis not present

## 2019-07-06 DIAGNOSIS — E119 Type 2 diabetes mellitus without complications: Secondary | ICD-10-CM | POA: Diagnosis not present

## 2019-07-06 DIAGNOSIS — I10 Essential (primary) hypertension: Secondary | ICD-10-CM | POA: Diagnosis not present

## 2019-07-06 DIAGNOSIS — R100 Acute abdomen: Secondary | ICD-10-CM | POA: Diagnosis not present

## 2019-07-06 DIAGNOSIS — R1030 Lower abdominal pain, unspecified: Secondary | ICD-10-CM | POA: Diagnosis not present

## 2019-07-06 DIAGNOSIS — J309 Allergic rhinitis, unspecified: Secondary | ICD-10-CM | POA: Diagnosis not present

## 2019-07-09 DIAGNOSIS — J309 Allergic rhinitis, unspecified: Secondary | ICD-10-CM | POA: Diagnosis not present

## 2019-07-09 DIAGNOSIS — R100 Acute abdomen: Secondary | ICD-10-CM | POA: Diagnosis not present

## 2019-07-09 DIAGNOSIS — R1030 Lower abdominal pain, unspecified: Secondary | ICD-10-CM | POA: Diagnosis not present

## 2019-07-09 DIAGNOSIS — I10 Essential (primary) hypertension: Secondary | ICD-10-CM | POA: Diagnosis not present

## 2019-08-19 ENCOUNTER — Other Ambulatory Visit: Payer: Self-pay | Admitting: Family Medicine

## 2019-08-19 ENCOUNTER — Other Ambulatory Visit: Payer: Self-pay

## 2019-08-19 ENCOUNTER — Ambulatory Visit
Admission: RE | Admit: 2019-08-19 | Discharge: 2019-08-19 | Disposition: A | Discharge status: Home / Self Care | Payer: PPO | Source: Ambulatory Visit | Attending: Family Medicine | Admitting: Family Medicine

## 2019-08-19 DIAGNOSIS — S0285XA Fracture of orbit, unspecified, initial encounter for closed fracture: Secondary | ICD-10-CM

## 2019-08-19 DIAGNOSIS — J309 Allergic rhinitis, unspecified: Secondary | ICD-10-CM | POA: Diagnosis not present

## 2019-08-19 DIAGNOSIS — H5712 Ocular pain, left eye: Secondary | ICD-10-CM | POA: Diagnosis not present

## 2019-08-19 DIAGNOSIS — M7989 Other specified soft tissue disorders: Secondary | ICD-10-CM | POA: Diagnosis not present

## 2019-08-19 DIAGNOSIS — S0990XA Unspecified injury of head, initial encounter: Secondary | ICD-10-CM | POA: Diagnosis not present

## 2019-08-19 DIAGNOSIS — R100 Acute abdomen: Secondary | ICD-10-CM | POA: Diagnosis not present

## 2019-08-19 DIAGNOSIS — I10 Essential (primary) hypertension: Secondary | ICD-10-CM | POA: Diagnosis not present

## 2019-08-20 DIAGNOSIS — J309 Allergic rhinitis, unspecified: Secondary | ICD-10-CM | POA: Diagnosis not present

## 2019-08-20 DIAGNOSIS — S0990XA Unspecified injury of head, initial encounter: Secondary | ICD-10-CM | POA: Diagnosis not present

## 2019-08-20 DIAGNOSIS — L249 Irritant contact dermatitis, unspecified cause: Secondary | ICD-10-CM | POA: Diagnosis not present

## 2019-08-20 DIAGNOSIS — I10 Essential (primary) hypertension: Secondary | ICD-10-CM | POA: Diagnosis not present

## 2019-08-30 DIAGNOSIS — Z23 Encounter for immunization: Secondary | ICD-10-CM | POA: Diagnosis not present

## 2019-08-30 DIAGNOSIS — J309 Allergic rhinitis, unspecified: Secondary | ICD-10-CM | POA: Diagnosis not present

## 2019-08-30 DIAGNOSIS — I10 Essential (primary) hypertension: Secondary | ICD-10-CM | POA: Diagnosis not present

## 2019-08-30 DIAGNOSIS — J111 Influenza due to unidentified influenza virus with other respiratory manifestations: Secondary | ICD-10-CM | POA: Diagnosis not present

## 2019-09-28 ENCOUNTER — Ambulatory Visit: Payer: PPO | Admitting: Cardiovascular Disease

## 2019-12-13 DIAGNOSIS — E785 Hyperlipidemia, unspecified: Secondary | ICD-10-CM | POA: Diagnosis not present

## 2019-12-13 DIAGNOSIS — J111 Influenza due to unidentified influenza virus with other respiratory manifestations: Secondary | ICD-10-CM | POA: Diagnosis not present

## 2019-12-13 DIAGNOSIS — E559 Vitamin D deficiency, unspecified: Secondary | ICD-10-CM | POA: Diagnosis not present

## 2019-12-13 DIAGNOSIS — I10 Essential (primary) hypertension: Secondary | ICD-10-CM | POA: Diagnosis not present

## 2019-12-13 DIAGNOSIS — E119 Type 2 diabetes mellitus without complications: Secondary | ICD-10-CM | POA: Diagnosis not present

## 2019-12-13 DIAGNOSIS — J309 Allergic rhinitis, unspecified: Secondary | ICD-10-CM | POA: Diagnosis not present

## 2019-12-13 DIAGNOSIS — Z0183 Encounter for blood typing: Secondary | ICD-10-CM | POA: Diagnosis not present

## 2020-05-17 ENCOUNTER — Ambulatory Visit: Payer: PPO | Admitting: Cardiovascular Disease

## 2020-05-26 ENCOUNTER — Ambulatory Visit (INDEPENDENT_AMBULATORY_CARE_PROVIDER_SITE_OTHER): Payer: Medicare Other | Admitting: Cardiovascular Disease

## 2020-05-26 ENCOUNTER — Other Ambulatory Visit: Payer: Self-pay

## 2020-05-26 ENCOUNTER — Encounter: Payer: Self-pay | Admitting: Cardiovascular Disease

## 2020-05-26 VITALS — BP 92/52 | HR 60 | Ht 65.0 in | Wt 138.0 lb

## 2020-05-26 DIAGNOSIS — E782 Mixed hyperlipidemia: Secondary | ICD-10-CM | POA: Diagnosis not present

## 2020-05-26 NOTE — Patient Instructions (Signed)
Medication Instructions:  No changes *If you need a refill on your cardiac medications before your next appointment, please call your pharmacy*   Lab Work: None ordered If you have labs (blood work) drawn today and your tests are completely normal, you will receive your results only by: . MyChart Message (if you have MyChart) OR . A paper copy in the mail If you have any lab test that is abnormal or we need to change your treatment, we will call you to review the results.   Testing/Procedures: None ordered   Follow-Up: At [facility], you and your health needs are our priority.  As part of our continuing mission to provide you with exceptional heart care, we have created designated Provider Care Teams.  These Care Teams include your primary Cardiologist (physician) and Advanced Practice Providers (APPs -  Physician Assistants and Nurse Practitioners) who all work together to provide you with the care you need, when you need it.  We recommend signing up for the patient portal called "MyChart".  Sign up information is provided on this After Visit Summary.  MyChart is used to connect with patients for Virtual Visits (Telemedicine).  Patients are able to view lab/test results, encounter notes, upcoming appointments, etc.  Non-urgent messages can be sent to your provider as well.   To learn more about what you can do with MyChart, go to [email or web address].    Your next appointment:   Follow up as needed with Dr. Berry 

## 2020-05-26 NOTE — Progress Notes (Signed)
05/26/2020 Shaun Sharp   1946/11/05  [id number]  Primary Physician Shaun Davenport, MD Primary Cardiologist: Shaun Gess MD Shaun Sharp, [place]  HPI:  Shaun Sharp is a 74 y.o.  thin appearing married Asian male father of 4 children. I take care of his wife and all his children as well. He was self referred for bradycardia hypotension and weakness.I last saw him for a virtual telemedicine video visit 05/13/2019. He was initially hospitalized on the telemetry medicine teaching service in July for the symptoms. His glaucoma eye drops containing off agonists were discontinued and he feels clinically improved. He has no cardiac risk factors other than 2 hypertension and hyperlipidemia. He does not smoke. There is no family history.  Since I saw him a year ago he is remained stable.  He denies chest pain or shortness of breath.   Current Meds  Medication Sig  . ASPIRIN 81 PO Take 81 mg by mouth daily.  Marland Kitchen lisinopril (PRINIVIL,ZESTRIL) 5 MG tablet Take 5 mg by mouth daily.  . pravastatin (PRAVACHOL) 10 MG tablet Take 10 mg by mouth daily.     No Known Allergies  Social History   Socioeconomic History  . Marital status: Married    Spouse name: Not on file  . Number of children: 4  . Years of education: Not on file  . Highest education level: Not on file  Occupational History  . Occupation: self employed  Tobacco Use  . Smoking status: Never Smoker  . Smokeless tobacco: Never Used  Substance and Sexual Activity  . Alcohol use: Yes    Comment: occasional beer once a year  . Drug use: Not on file  . Sexual activity: Not on file  Other Topics Concern  . Not on file  Social History Narrative  . Not on file   Social Determinants of Health   Financial Resource Strain:   . Difficulty of Paying Living Expenses:   Food Insecurity:   . Worried About Programme researcher, broadcasting/film/video in the Last Year:   . Barista in the Last Year:   Transportation Needs:   . Automotive engineer (Medical):   Marland Kitchen Lack of Transportation (Non-Medical):   Physical Activity:   . Days of Exercise per Week:   . Minutes of Exercise per Session:   Stress:   . Feeling of Stress :   Social Connections:   . Frequency of Communication with Friends and Family:   . Frequency of Social Gatherings with Friends and Family:   . Attends Religious Services:   . Active Member of Clubs or Organizations:   . Attends Banker Meetings:   Marland Kitchen Marital Status:   Intimate Partner Violence:   . Fear of Current or Ex-Partner:   . Emotionally Abused:   Marland Kitchen Physically Abused:   . Sexually Abused:      Review of Systems: General: negative for chills, fever, night sweats or weight changes.  Cardiovascular: negative for chest pain, dyspnea on exertion, edema, orthopnea, palpitations, paroxysmal nocturnal dyspnea or shortness of breath Dermatological: negative for rash Respiratory: negative for cough or wheezing Urologic: negative for hematuria Abdominal: negative for nausea, vomiting, diarrhea, bright red blood per rectum, melena, or hematemesis Neurologic: negative for visual changes, syncope, or dizziness All other systems reviewed and are otherwise negative except as noted above.    Blood pressure (!) 92/52, pulse 60, height 5\' 5"  (1.651 m), weight 138 lb (62.6 kg).  General appearance:  alert and no distress Neck: no adenopathy, no carotid bruit, no JVD, supple, symmetrical, trachea midline and thyroid not enlarged, symmetric, no tenderness/mass/nodules Lungs: clear to auscultation bilaterally Heart: regular rate and rhythm, S1, S2 normal, no murmur, click, rub or gallop Extremities: extremities normal, atraumatic, no cyanosis or edema Pulses: 2+ and symmetric Skin: Skin color, texture, turgor normal. No rashes or lesions Neurologic: Alert and oriented X 3, normal strength and tone. Normal symmetric reflexes. Normal coordination and gait  EKG sinus rhythm at 60 without ST or T  wave changes.  There were septal Q waves.  I personally reviewed this EKG.  ASSESSMENT AND PLAN:   Hyperlipidemia History of hyperlipidemia on statin therapy followed by his PCP      Shaun Harp MD [facility], [facility] 05/26/2020 10:59 AM

## 2020-05-26 NOTE — Assessment & Plan Note (Signed)
History of hyperlipidemia on statin therapy followed by his PCP 

## 2020-06-15 ENCOUNTER — Telehealth: Payer: Self-pay | Admitting: Physician Assistant

## 2020-06-15 NOTE — Telephone Encounter (Signed)
Was a referral: wife called to cancel appointment for 6/23. Didn't reschedule

## 2020-06-21 ENCOUNTER — Ambulatory Visit: Payer: PPO | Admitting: Physician Assistant

## 2020-08-10 ENCOUNTER — Other Ambulatory Visit: Payer: Self-pay | Admitting: Family Medicine

## 2020-08-10 DIAGNOSIS — R1011 Right upper quadrant pain: Secondary | ICD-10-CM

## 2020-08-18 ENCOUNTER — Other Ambulatory Visit: Payer: Medicare Other

## 2020-08-25 ENCOUNTER — Ambulatory Visit
Admission: RE | Admit: 2020-08-25 | Discharge: 2020-08-25 | Disposition: A | Discharge status: Home / Self Care | Payer: Medicare Other | Source: Ambulatory Visit | Attending: Family Medicine | Admitting: Family Medicine

## 2020-08-25 DIAGNOSIS — R1011 Right upper quadrant pain: Secondary | ICD-10-CM

## 2020-09-11 ENCOUNTER — Ambulatory Visit: Payer: Medicare Other | Admitting: Cardiovascular Disease

## 2020-09-11 ENCOUNTER — Encounter: Payer: Self-pay | Admitting: Cardiovascular Disease

## 2020-09-11 VITALS — BP 94/58 | HR 61 | Ht 65.0 in | Wt 138.0 lb

## 2020-09-11 DIAGNOSIS — R42 Dizziness and giddiness: Secondary | ICD-10-CM | POA: Diagnosis not present

## 2020-09-11 DIAGNOSIS — E782 Mixed hyperlipidemia: Secondary | ICD-10-CM | POA: Diagnosis not present

## 2020-09-11 DIAGNOSIS — I950 Idiopathic hypotension: Secondary | ICD-10-CM

## 2020-09-11 NOTE — Assessment & Plan Note (Signed)
History of dizziness in the past.  He is recently done in [place] with his wife and was standing in line at [facility] and got presyncopal.  He was taken to the emergency room and apparently had a negative work-up.  He said no recurrent symptoms.

## 2020-09-11 NOTE — Assessment & Plan Note (Signed)
History of hyperlipidemia on statin therapy followed by his PCP 

## 2020-09-11 NOTE — Patient Instructions (Signed)
Medication Instructions:  Your physician recommends that you continue on your current medications as directed. Please refer to the Current Medication list given to you today.  *If you need a refill on your cardiac medications before your next appointment, please call your pharmacy*   Follow-Up: At [facility], you and your health needs are our priority.  As part of our continuing mission to provide you with exceptional heart care, we have created designated Provider Care Teams.  These Care Teams include your primary Cardiologist (physician) and Advanced Practice Providers (APPs -  Physician Assistants and Nurse Practitioners) who all work together to provide you with the care you need, when you need it.  We recommend signing up for the patient portal called "MyChart".  Sign up information is provided on this After Visit Summary.  MyChart is used to connect with patients for Virtual Visits (Telemedicine).  Patients are able to view lab/test results, encounter notes, upcoming appointments, etc.  Non-urgent messages can be sent to your provider as well.   To learn more about what you can do with MyChart, go to [email or web address].    Your next appointment:   6 month(s)  The format for your next appointment:   In Person  Provider:   You may see Nanetta Batty, MD or one of the following Advanced Practice Providers on your designated Care Team:    Corine Shelter, PA-C  [place], [place]  Edd Fabian, [place]

## 2020-09-11 NOTE — Progress Notes (Signed)
09/11/2020 Shaun Sharp   [birth year]  [id number]  Primary Physician Dois Davenport, MD Primary Cardiologist: Runell Gess MD Nicholes Calamity, [place]  HPI:  Shaun Sharp is a 74 y.o.  thin appearing married Asian male father of 4 children. I take care of his wife and all his children as well. He was self referred for bradycardia hypotension and weakness.I last saw him  in the office 05/26/2020.Marland Kitchen He was initially hospitalized on the telemetry medicine teaching service in July for the symptoms. His glaucoma eye drops containing off agonists were discontinued and he feels clinically improved. He has no cardiac risk factors other than 2 hypertension and hyperlipidemia. He does not smoke. There is no family history.  Since I saw him 4 months ago he was done in [place] in early August and apparently got presyncopal while standing in line at [employer]  He was taken to the emergency room however the work-up was unrevealing.  He has had no recurrent symptoms.  He denies chest pain or shortness of breath.   Current Meds  Medication Sig   amLODipine (NORVASC) 2.5 MG tablet Take 2.5 mg by mouth daily.   ASPIRIN 81 PO Take 81 mg by mouth daily.   pravastatin (PRAVACHOL) 10 MG tablet Take 10 mg by mouth daily.     No Known Allergies  Social History   Socioeconomic History   Marital status: Married    Spouse name: Not on file   Number of children: 4   Years of education: Not on file   Highest education level: Not on file  Occupational History   Occupation: self employed  Tobacco Use   Smoking status: Never Smoker   Smokeless tobacco: Never Used  Substance and Sexual Activity   Alcohol use: Yes    Comment: occasional beer once a year   Drug use: Not on file   Sexual activity: Not on file  Other Topics Concern   Not on file  Social History Narrative   Not on file   Social Determinants of Health   Financial Resource Strain:    Difficulty of Paying Living  Expenses: Not on file  Food Insecurity:    Worried About Running Out of Food in the Last Year: Not on file   Ran Out of Food in the Last Year: Not on file  Transportation Needs:    Lack of Transportation (Medical): Not on file   Lack of Transportation (Non-Medical): Not on file  Physical Activity:    Days of Exercise per Week: Not on file   Minutes of Exercise per Session: Not on file  Stress:    Feeling of Stress : Not on file  Social Connections:    Frequency of Communication with Friends and Family: Not on file   Frequency of Social Gatherings with Friends and Family: Not on file   Attends Religious Services: Not on file   Active Member of Clubs or Organizations: Not on file   Attends Banker Meetings: Not on file   Marital Status: Not on file  Intimate Partner Violence:    Fear of Current or Ex-Partner: Not on file   Emotionally Abused: Not on file   Physically Abused: Not on file   Sexually Abused: Not on file     Review of Systems: General: negative for chills, fever, night sweats or weight changes.  Cardiovascular: negative for chest pain, dyspnea on exertion, edema, orthopnea, palpitations, paroxysmal nocturnal dyspnea or shortness of breath Dermatological: negative  for rash Respiratory: negative for cough or wheezing Urologic: negative for hematuria Abdominal: negative for nausea, vomiting, diarrhea, bright red blood per rectum, melena, or hematemesis Neurologic: negative for visual changes, syncope, or dizziness All other systems reviewed and are otherwise negative except as noted above.    Blood pressure (!) 94/58, pulse 61, height 5\' 5"  (1.651 m), weight 138 lb (62.6 kg).  General appearance: alert and no distress Neck: no adenopathy, no carotid bruit, no JVD, supple, symmetrical, trachea midline and thyroid not enlarged, symmetric, no tenderness/mass/nodules Lungs: clear to auscultation bilaterally Heart: regular rate and rhythm,  S1, S2 normal, no murmur, click, rub or gallop Extremities: extremities normal, atraumatic, no cyanosis or edema Pulses: 2+ and symmetric Skin: Skin color, texture, turgor normal. No rashes or lesions Neurologic: Alert and oriented X 3, normal strength and tone. Normal symmetric reflexes. Normal coordination and gait  EKG sinus rhythm at 61 without ST or T wave changes.  I personally reviewed this EKG.  ASSESSMENT AND PLAN:   Hyperlipidemia History of hyperlipidemia on statin therapy followed by his PCP  Dizziness History of dizziness in the past.  He is recently done in with his wife and was standing in line at [facility] and got presyncopal.  He was taken to the emergency room and apparently had a negative work-up.  He said no recurrent symptoms.      [facility] MD FACP,FACC,FAHA, [facility] 09/11/2020 8:52 AM

## 2020-09-22 IMAGING — CT CT MAXILLOFACIAL WITHOUT CONTRAST
1 series · 16 of 30 positions shown, 20 images · non-contrast
Comparison: None.

CLINICAL DATA: Left supraorbital swelling and pain for 4 days.

EXAM:
CT MAXILLOFACIAL WITHOUT CONTRAST
TECHNIQUE: Multidetector CT imaging of the maxillofacial structures was
performed. Multiplanar CT image reconstructions were also generated.

[Series 7: cor soft · coronal · 0.34mm/px · 16 of 79 slices shown, 20 images]
[im 3/79  brain]
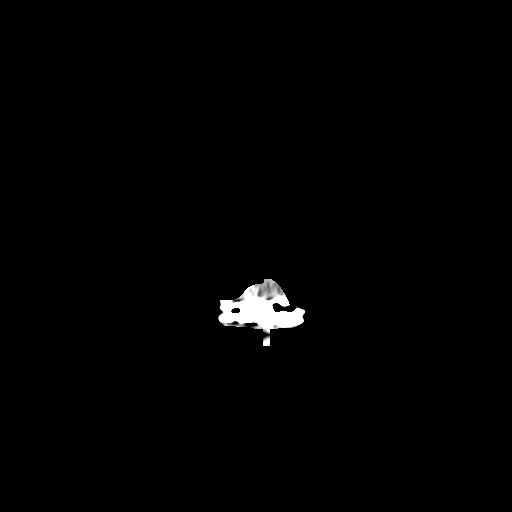
[im 3/79  bone]
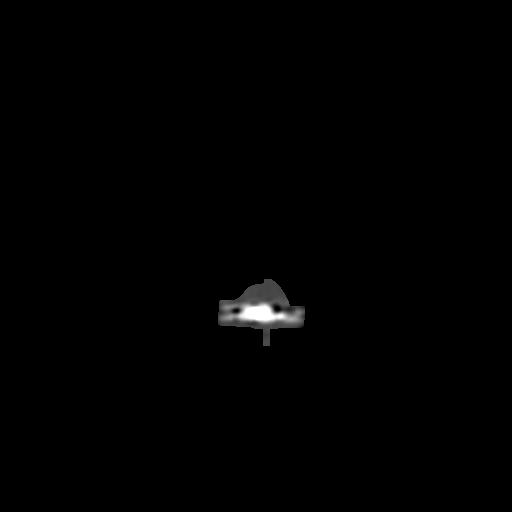
[im 9/79  bone]
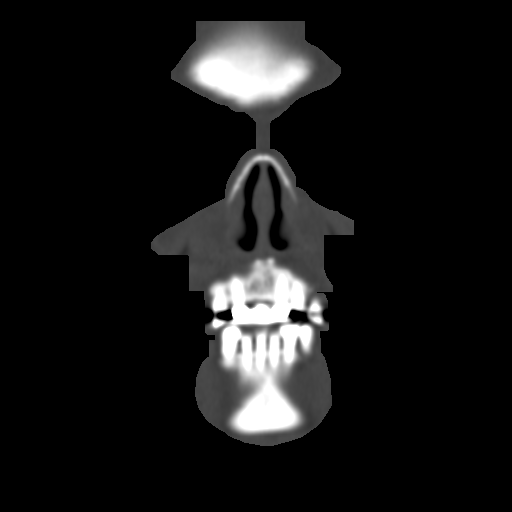
[im 14/79  bone]
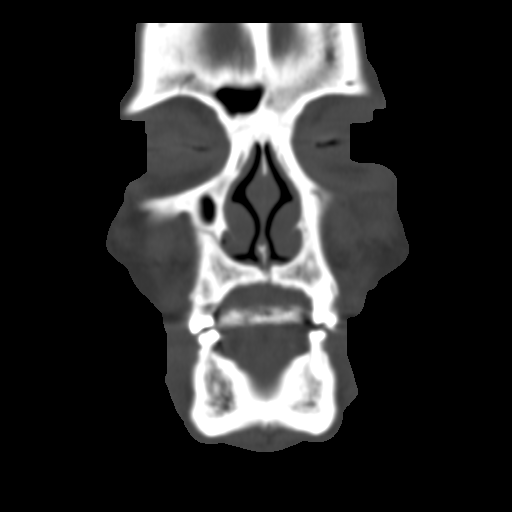
[im 19/79  bone]
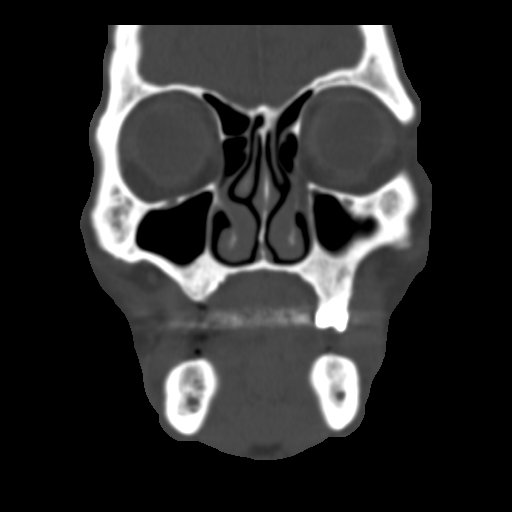
[im 22/79  brain]
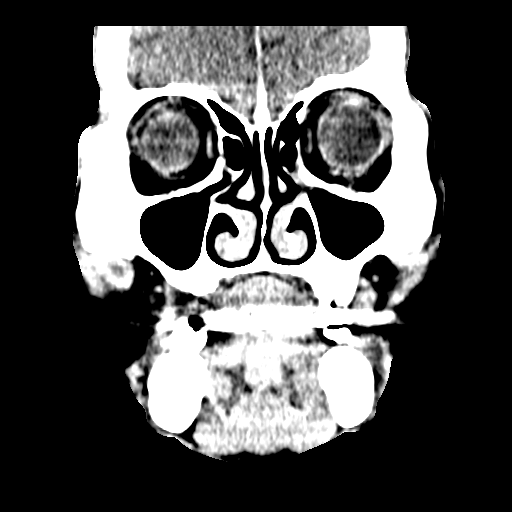
[im 22/79  bone]
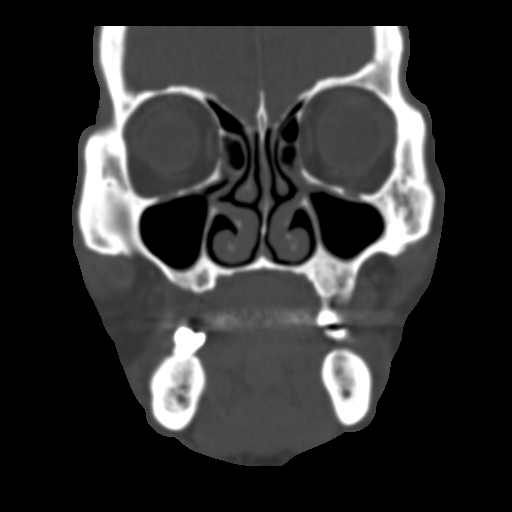
[im 27/79  bone]
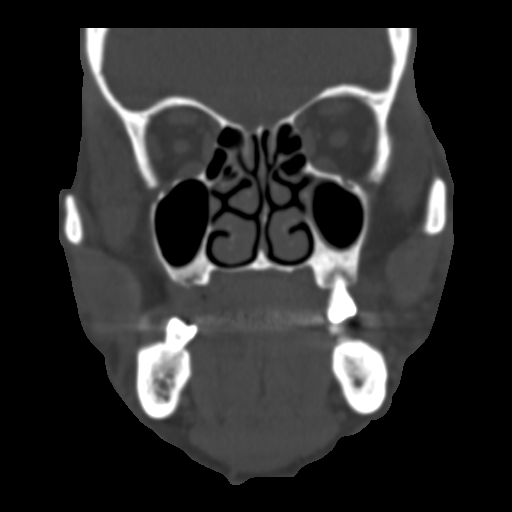
[im 33/79  bone]
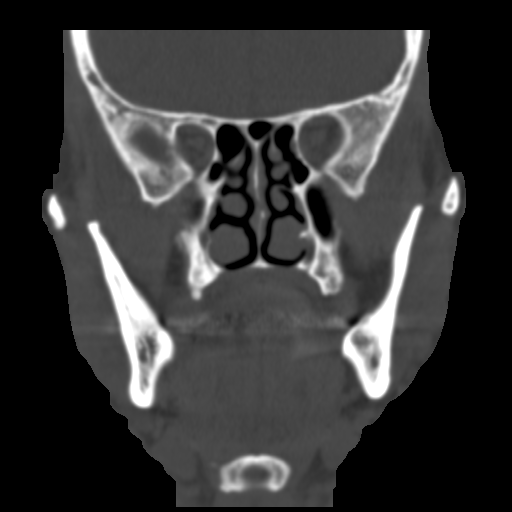
[im 38/79  bone]
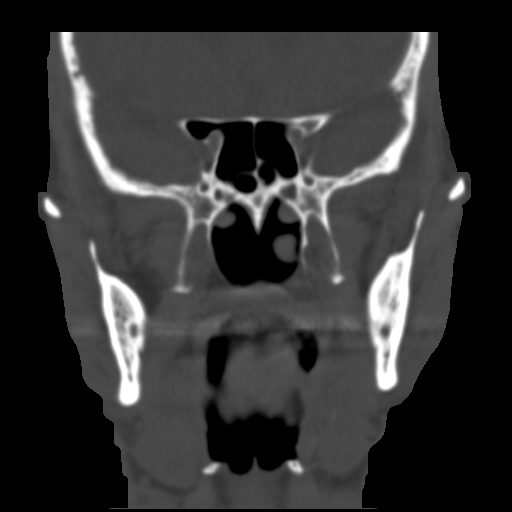
[im 41/79  brain]
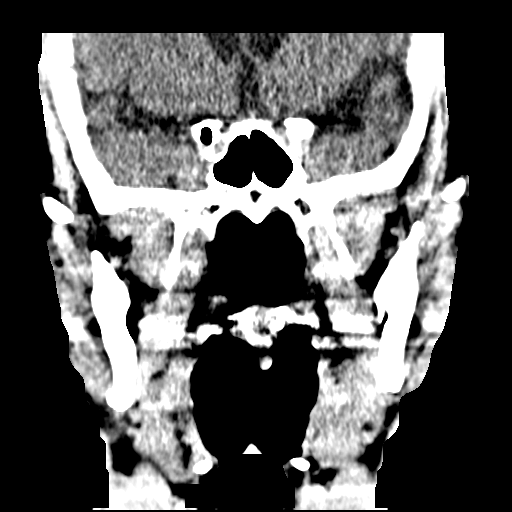
[im 41/79  bone]
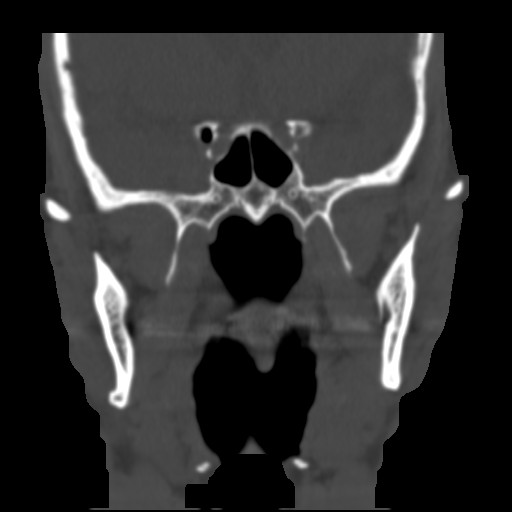
[im 46/79  bone]
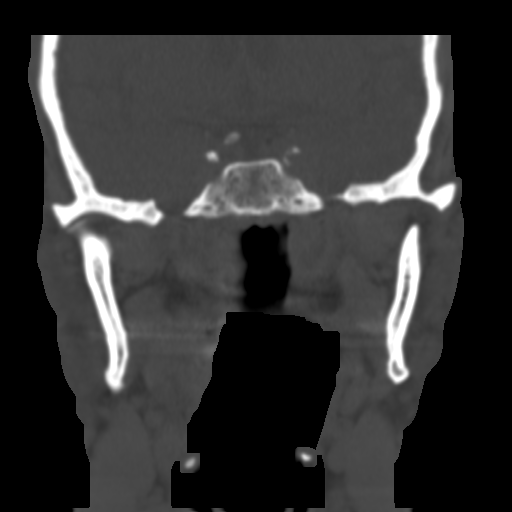
[im 52/79  bone]
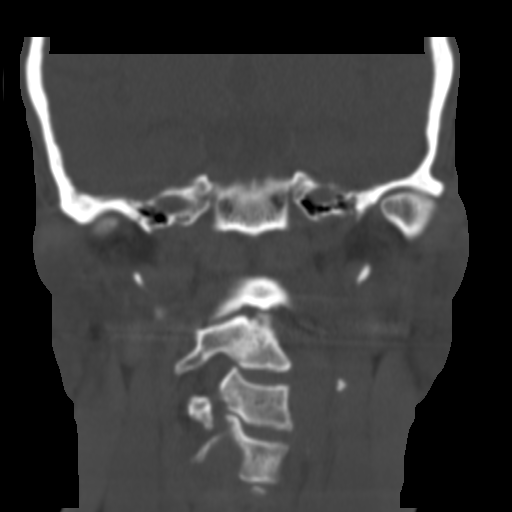
[im 57/79  bone]
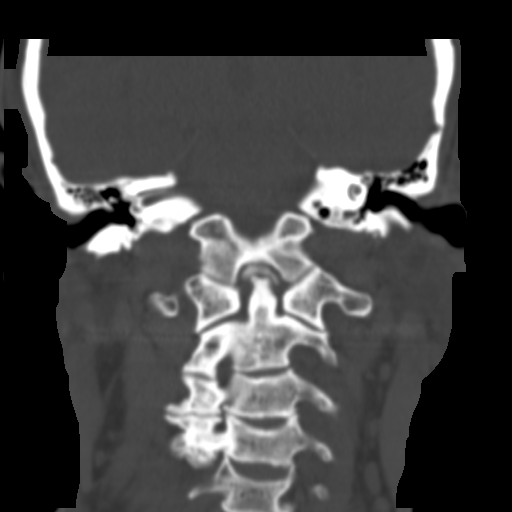
[im 60/79  brain]
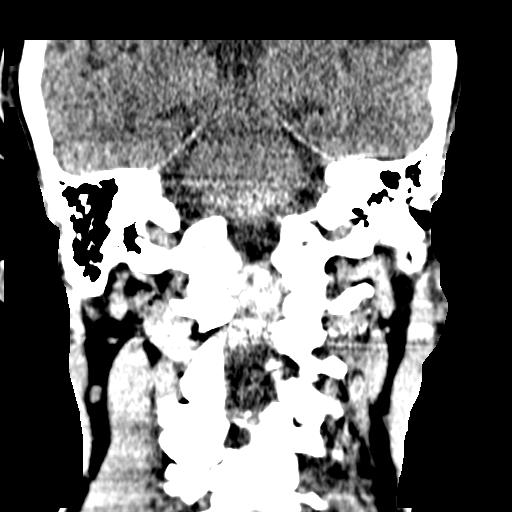
[im 60/79  bone]
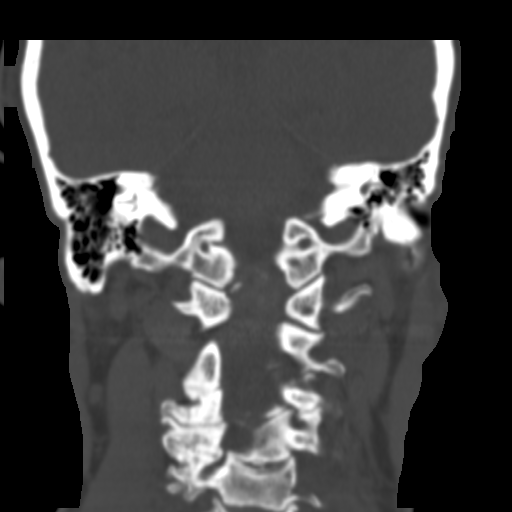
[im 65/79  bone]
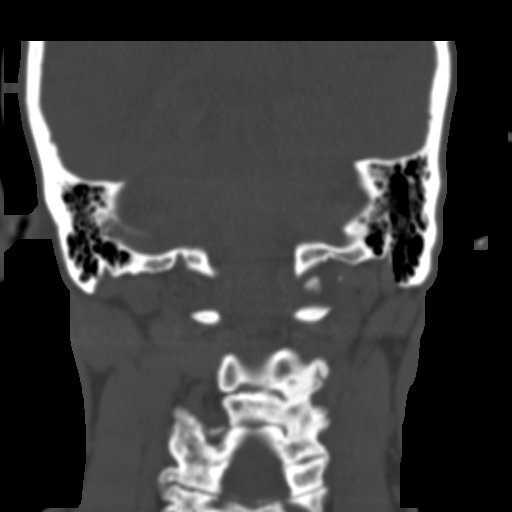
[im 70/79  bone]
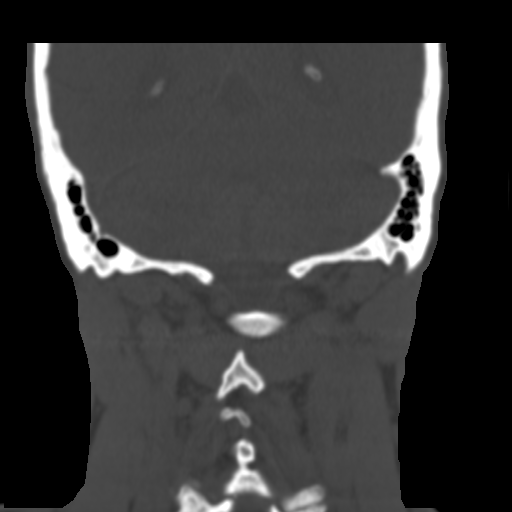
[im 76/79  bone]
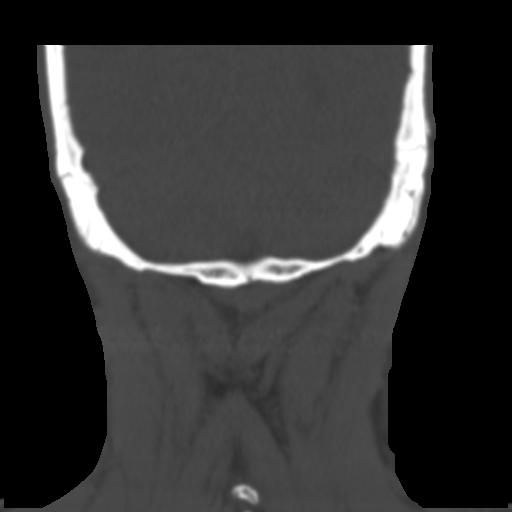

[16 of 30 positions shown; findings below may reference images not displayed]

FINDINGS: Osseous: No fracture or mandibular dislocation. No destructive
process.

Orbits: Negative. No traumatic or inflammatory finding.

Sinuses: Clear.

Soft tissues: There is asymmetric left-sided preseptal and
infraorbital soft tissue edema and skin thickening. No fluid
collection identified.

Limited intracranial: No significant or unexpected finding.
IMPRESSION: 1. Left-sided preseptal and infraorbital soft tissue swelling. No
evidence for orbital fracture.

## 2020-12-26 LAB — EXTERNAL GENERIC LAB PROCEDURE: COLOGUARD: NEGATIVE

## 2021-03-13 ENCOUNTER — Ambulatory Visit: Payer: Medicare Other | Admitting: Cardiovascular Disease

## 2021-05-02 ENCOUNTER — Encounter: Payer: Self-pay | Admitting: Cardiovascular Disease

## 2021-05-02 ENCOUNTER — Other Ambulatory Visit: Payer: Self-pay

## 2021-05-02 ENCOUNTER — Ambulatory Visit: Payer: Medicare Other | Admitting: Cardiovascular Disease

## 2021-05-02 DIAGNOSIS — R001 Bradycardia, unspecified: Secondary | ICD-10-CM | POA: Diagnosis not present

## 2021-05-02 NOTE — Assessment & Plan Note (Signed)
History of symptomatic bradycardia in past thought to be related to his eyedrops.  Once these were discontinued his bradycardia resolved.

## 2021-05-02 NOTE — Assessment & Plan Note (Signed)
History of hyperlipidemia on statin therapy followed by his PCP 

## 2021-05-02 NOTE — Progress Notes (Signed)
05/02/2021 Shaun Sharp   1946/07/18  [id number]  Primary Physician Dois Davenport, MD Primary Cardiologist: Runell Gess MD Nicholes Calamity, [place]  HPI:  Shaun Sharp is a 75 y.o.   thin appearing married Asian male father of 4 children. I take care of his wife and all his children as well. He was self referred for bradycardia hypotension and weakness.I last saw him in the office 09/11/2020.Marland Kitchen He was initially hospitalized on the telemetry medicine teaching service in July for the symptoms. His glaucoma eye drops containing off agonists were discontinued and he feels clinically improved. He has no cardiac risk factors other than 2 hypertension and hyperlipidemia. He does not smoke. There is no family history.  He was in [place] in early August 2021 and apparently got presyncopal while standing in line at [employer]  He was taken to the emergency room however the work-up was unrevealing.  He has had no recurrent symptoms.    Since I saw him 9 months ago he continues to do well.  He said no further episodes of bradycardia or dizziness.  He denies chest pain or shortness of breath.  Current Meds  Medication Sig  . amLODipine (NORVASC) 2.5 MG tablet Take 2.5 mg by mouth daily.  . ASPIRIN 81 PO Take 81 mg by mouth daily.  . cetirizine (ZYRTEC) 10 MG tablet Take 10 mg by mouth daily.  . pravastatin (PRAVACHOL) 10 MG tablet Take 10 mg by mouth daily.     No Known Allergies  Social History   Socioeconomic History  . Marital status: Married    Spouse name: Not on file  . Number of children: 4  . Years of education: Not on file  . Highest education level: Not on file  Occupational History  . Occupation: self employed  Tobacco Use  . Smoking status: Never Smoker  . Smokeless tobacco: Never Used  Substance and Sexual Activity  . Alcohol use: Yes    Comment: occasional beer once a year  . Drug use: Not on file  . Sexual activity: Not on file  Other Topics Concern  . Not on  file  Social History Narrative  . Not on file   Social Determinants of Health   Financial Resource Strain: Not on file  Food Insecurity: Not on file  Transportation Needs: Not on file  Physical Activity: Not on file  Stress: Not on file  Social Connections: Not on file  Intimate Partner Violence: Not on file     Review of Systems: General: negative for chills, fever, night sweats or weight changes.  Cardiovascular: negative for chest pain, dyspnea on exertion, edema, orthopnea, palpitations, paroxysmal nocturnal dyspnea or shortness of breath Dermatological: negative for rash Respiratory: negative for cough or wheezing Urologic: negative for hematuria Abdominal: negative for nausea, vomiting, diarrhea, bright red blood per rectum, melena, or hematemesis Neurologic: negative for visual changes, syncope, or dizziness All other systems reviewed and are otherwise negative except as noted above.    Blood pressure (!) 106/52, pulse 63, height 5\' 5"  (1.651 m), weight 138 lb (62.6 kg).  General appearance: alert and no distress Neck: no adenopathy, no carotid bruit, no JVD, supple, symmetrical, trachea midline and thyroid not enlarged, symmetric, no tenderness/mass/nodules Lungs: clear to auscultation bilaterally Heart: regular rate and rhythm, S1, S2 normal, no murmur, click, rub or gallop Extremities: extremities normal, atraumatic, no cyanosis or edema Pulses: 2+ and symmetric Skin: Skin color, texture, turgor normal. No rashes or lesions Neurologic:  Alert and oriented X 3, normal strength and tone. Normal symmetric reflexes. Normal coordination and gait  EKG normal sinus rhythm at 63 without ST or T wave changes.  I personally reviewed this EKG.  ASSESSMENT AND PLAN:   Bradycardia History of symptomatic bradycardia in past thought to be related to his eyedrops.  Once these were discontinued his bradycardia resolved.  Hyperlipidemia History of hyperlipidemia on statin therapy  followed by his PCP      Runell Gess MD [facility], [facility] 05/02/2021 11:37 AM

## 2021-05-02 NOTE — Patient Instructions (Signed)
Medication Instructions:  No Changes In Medications at this time.  *If you need a refill on your cardiac medications before your next appointment, please call your pharmacy*  Follow-Up: At [facility], you and your health needs are our priority.  As part of our continuing mission to provide you with exceptional heart care, we have created designated Provider Care Teams.  These Care Teams include your primary Cardiologist (physician) and Advanced Practice Providers (APPs -  Physician Assistants and Nurse Practitioners) who all work together to provide you with the care you need, when you need it.  Your next appointment:   1 year(s)  The format for your next appointment:   In Person  Provider:   Jonathan Berry, MD  

## 2022-02-04 ENCOUNTER — Encounter: Payer: Self-pay | Admitting: Cardiovascular Disease

## 2022-02-04 NOTE — Telephone Encounter (Signed)
Spoke with pt wife per his request, she reports Saturday he had an episode where his bp was 170/99. He was tired, his face got red and he reported feeling heavy in the back of his head. He took 2 aspirin and also complained of dizziness so they called 911. They were told by EMS that there were too many people at the hospital and they needed to stay home. Sunday he was fine and his bp was back to normal. This morning he had the same type of symptoms with dizziness and discomfort in the back of his head. His bp was 122/88 and then 98/70. She reports his heart rate was elevated at 101 and she felt it was irregular. He feels fine now just tired and weak. They are asking for an appointment to be seen. She reports that she feels her atrial fib has gotten worse as she can feel it everyday now. Follow up scheduled with dr berry

## 2022-02-05 ENCOUNTER — Ambulatory Visit: Payer: Medicare Other | Admitting: Cardiovascular Disease

## 2022-02-05 ENCOUNTER — Other Ambulatory Visit: Payer: Self-pay

## 2022-02-05 ENCOUNTER — Encounter: Payer: Self-pay | Admitting: Cardiovascular Disease

## 2022-02-05 VITALS — BP 107/64 | HR 66 | Ht 63.0 in | Wt 140.0 lb

## 2022-02-05 DIAGNOSIS — R001 Bradycardia, unspecified: Secondary | ICD-10-CM

## 2022-02-05 DIAGNOSIS — R42 Dizziness and giddiness: Secondary | ICD-10-CM | POA: Diagnosis not present

## 2022-02-05 DIAGNOSIS — E782 Mixed hyperlipidemia: Secondary | ICD-10-CM | POA: Diagnosis not present

## 2022-02-05 NOTE — Assessment & Plan Note (Signed)
History of bradycardia in the past with dizziness although this no longer is an issue.  We did do a monitor on him that did not show significant bradycardia.  We thought these episodes may have been related to eyedrops.

## 2022-02-05 NOTE — Progress Notes (Signed)
02/05/2022 Shaun Sharp   Nov 22, 1946  [id number]  Primary Physician Shaun Rasmussen, MD Primary Cardiologist: Shaun Harp MD Shaun Sharp, [place]  HPI:  Shaun Sharp is a 76 y.o.    thin appearing married Asian male father of 4 children. I take care of his wife and all his children as well. He was self referred for bradycardia hypotension and weakness. I last saw him  in the office 5//22.Marland Kitchen  He was initially hospitalized on the telemetry medicine teaching service in July for the symptoms. His glaucoma eye drops containing off agonists were discontinued and he feels clinically improved. He has no cardiac risk factors other than 2 hypertension and hyperlipidemia. He does not smoke. There is no family history.   He was in [place] in early August 2021 and apparently got presyncopal while standing in line at [employer].  He was taken to the emergency room however the work-up was unrevealing.  He has had no recurrent symptoms.     Since I saw him 9 months ago he continues to do well.  He said no further episodes of bradycardia or dizziness.  He denies chest pain or shortness of breath.  He did recently have an episode of hypertension after going out to the [place] food probably from the salt load but this is not his norm.   Current Meds  Medication Sig   amLODipine (NORVASC) 2.5 MG tablet Take 2.5 mg by mouth daily.   ASPIRIN 81 PO Take 81 mg by mouth daily.   cetirizine (ZYRTEC) 10 MG tablet Take 10 mg by mouth daily.   pravastatin (PRAVACHOL) 10 MG tablet Take 10 mg by mouth daily.     No Known Allergies  Social History   Socioeconomic History   Marital status: Married    Spouse name: Not on file   Number of children: 4   Years of education: Not on file   Highest education level: Not on file  Occupational History   Occupation: self employed  Tobacco Use   Smoking status: Never   Smokeless tobacco: Never  Substance and Sexual Activity   Alcohol use: Yes    Comment:  occasional beer once a year   Drug use: Not on file   Sexual activity: Not on file  Other Topics Concern   Not on file  Social History Narrative   Not on file   Social Determinants of Health   Financial Resource Strain: Not on file  Food Insecurity: Not on file  Transportation Needs: Not on file  Physical Activity: Not on file  Stress: Not on file  Social Connections: Not on file  Intimate Partner Violence: Not on file     Review of Systems: General: negative for chills, fever, night sweats or weight changes.  Cardiovascular: negative for chest pain, dyspnea on exertion, edema, orthopnea, palpitations, paroxysmal nocturnal dyspnea or shortness of breath Dermatological: negative for rash Respiratory: negative for cough or wheezing Urologic: negative for hematuria Abdominal: negative for nausea, vomiting, diarrhea, bright red blood per rectum, melena, or hematemesis Neurologic: negative for visual changes, syncope, or dizziness All other systems reviewed and are otherwise negative except as noted above.    Blood pressure 107/64, pulse 66, height 5\' 3"  (1.6 m), weight 140 lb 0.6 oz (63.5 kg), SpO2 97 %.  General appearance: alert and no distress Neck: no adenopathy, no carotid bruit, no JVD, supple, symmetrical, trachea midline, and thyroid not enlarged, symmetric, no tenderness/mass/nodules Lungs: clear to auscultation bilaterally Heart:  regular rate and rhythm, S1, S2 normal, no murmur, click, rub or gallop Extremities: extremities normal, atraumatic, no cyanosis or edema Pulses: 2+ and symmetric Skin: Skin color, texture, turgor normal. No rashes or lesions Neurologic: Grossly normal  EKG sinus rhythm at 66 without ST or T wave changes.  I personally reviewed this EKG.  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  We did do a monitor on him that did not show significant bradycardia.  We thought  these episodes may have been related to eyedrops.  Hyperlipidemia History of hyperlipidemia on statin therapy followed by his PCP.     Shaun Harp MD FACP,FACC,FAHA, [facility] 02/05/2022 3:15 PM

## 2022-02-05 NOTE — Assessment & Plan Note (Signed)
History of hyperlipidemia on statin therapy followed by his PCP 

## 2022-02-05 NOTE — Patient Instructions (Signed)

## 2022-12-16 ENCOUNTER — Encounter: Payer: Self-pay | Admitting: Gastroenterology

## 2023-01-28 ENCOUNTER — Ambulatory Visit: Payer: Medicare Other | Admitting: Gastroenterology

## 2023-01-28 ENCOUNTER — Other Ambulatory Visit (INDEPENDENT_AMBULATORY_CARE_PROVIDER_SITE_OTHER): Payer: Medicare Other

## 2023-01-28 ENCOUNTER — Encounter: Payer: Self-pay | Admitting: Gastroenterology

## 2023-01-28 VITALS — BP 112/67 | HR 83 | Ht 63.0 in | Wt 132.0 lb

## 2023-01-28 DIAGNOSIS — K642 Third degree hemorrhoids: Secondary | ICD-10-CM

## 2023-01-28 DIAGNOSIS — D649 Anemia, unspecified: Secondary | ICD-10-CM

## 2023-01-28 DIAGNOSIS — K5900 Constipation, unspecified: Secondary | ICD-10-CM | POA: Diagnosis not present

## 2023-01-28 DIAGNOSIS — R634 Abnormal weight loss: Secondary | ICD-10-CM

## 2023-01-28 LAB — COMPREHENSIVE METABOLIC PANEL
ALT: 18 U/L (ref 0–53)
AST: 19 U/L (ref 0–37)
Albumin: 4.4 g/dL (ref 3.5–5.2)
Alkaline Phosphatase: 53 U/L (ref 39–117)
BUN: 17 mg/dL (ref 6–23)
CO2: 25 mEq/L (ref 19–32)
Calcium: 9.6 mg/dL (ref 8.4–10.5)
Chloride: 105 mEq/L (ref 96–112)
Creatinine, Ser: 1.11 mg/dL (ref 0.40–1.50)
GFR: 63.49 mL/min (ref >60.00)
Glucose, Bld: 66 mg/dL — ABNORMAL LOW (ref 70–99)
Potassium: 3.9 mEq/L (ref 3.5–5.1)
Sodium: 138 mEq/L (ref 135–145)
Total Bilirubin: 0.6 mg/dL (ref 0.2–1.2)
Total Protein: 7.3 g/dL (ref 6.0–8.3)

## 2023-01-28 LAB — IBC + FERRITIN
Ferritin: 98.1 ng/mL (ref 22.0–322.0)
Iron: 80 ug/dL (ref 42–165)
Saturation Ratios: 25.9 % (ref 20.0–50.0)
TIBC: 309.4 ug/dL (ref 250.0–450.0)
Transferrin: 221 mg/dL (ref 212.0–360.0)

## 2023-01-28 LAB — CBC WITH DIFFERENTIAL/PLATELET
Basophils Absolute: 0 10*3/uL (ref 0.0–0.1)
Basophils Relative: 0.7 % (ref 0.0–3.0)
Eosinophils Absolute: 0 10*3/uL (ref 0.0–0.7)
Eosinophils Relative: 0.9 % (ref 0.0–5.0)
HCT: 38.6 % — ABNORMAL LOW (ref 39.0–52.0)
Hemoglobin: 12.6 g/dL — ABNORMAL LOW (ref 13.0–17.0)
Lymphocytes Relative: 30.6 % (ref 12.0–46.0)
Lymphs Abs: 1.5 10*3/uL (ref 0.7–4.0)
MCHC: 32.8 g/dL (ref 30.0–36.0)
MCV: 85.8 fl (ref 78.0–100.0)
Monocytes Absolute: 0.4 10*3/uL (ref 0.1–1.0)
Monocytes Relative: 8.9 % (ref 3.0–12.0)
Neutro Abs: 2.8 10*3/uL (ref 1.4–7.7)
Neutrophils Relative %: 58.9 % (ref 43.0–77.0)
Platelets: 182 10*3/uL (ref 150.0–400.0)
RBC: 4.49 Mil/uL (ref 4.22–5.81)
RDW: 13.7 % (ref 11.5–15.5)
WBC: 4.8 10*3/uL (ref 4.0–10.5)

## 2023-01-28 LAB — TSH: TSH: 2.05 u[IU]/mL (ref 0.35–5.50)

## 2023-01-28 LAB — FOLATE: Folate: >23.8 ng/mL (ref >5.9)

## 2023-01-28 LAB — VITAMIN B12: Vitamin B-12: 187 pg/mL — ABNORMAL LOW (ref 211–911)

## 2023-01-28 NOTE — Progress Notes (Signed)
Assessment    Change in bowel habits, constipation - R/O colorectal neoplasms Weight loss, unintentional - R/O occult neoplasm Grade 3 internal hemorrhoids Anemia,  R/O iron, B12 folate deficiency  Recommendations   CBC, CMP, TSH, iron, TIBC, ferritin, B12, folate today Schedule CT AP Begin MiraLAX daily, Dulcolax daily as needed for refractory constipation Recommended colonoscopy. The risks (including bleeding, perforation, infection, missed lesions, medication reactions and possible hospitalization or surgery if complications occur), benefits, and alternatives to colonoscopy with possible biopsy and possible polypectomy were discussed with the patient and they consent to proceed.  Unfortunately the patient declines to schedule colonoscopy as recommended. Hemorrhoid management options include hemorrhoidal banding or surgical referral which will be deferred until his above evaluation has been completed   HPI   Chief complaint: Change in bowel habits, constipation, weight loss, hemorrhoids  Patient profile:  Shaun Sharp is a 77 y.o. male referred by Horald Pollen, MD for evaluation of constipation and hemorrhoids.  He is accompanied by his wife.  He has a history of grade 3 internal hemorrhoids evaluated in 2019.  Colonoscopy was recommended and not completed.  Surgical referral was recommended and not completed.  He relates a change in bowel habits over the past few months with worsening problems with constipation.  He takes 2 Dulcolax intermittently.  He has noted a 10 pound weight loss without a change in appetite.  Blood work from 2021 showed the following abnormalities: Glucose 122, creatinine 1.31, BUN 28, calcium 8.4.  CBC in 2021 showed the following abnormalities: Hemoglobin 12.5 MCH 27.4.  Abdominal ultrasound unremarkable in August 2021.  He is reluctant to proceed with colonoscopy but will agree. Denies abdominal pain, diarrhea, change in stool caliber, melena, hematochezia,  nausea, vomiting, dysphagia, reflux symptoms, chest pain.   Previous Labs ::  2021 as above  Previous GI evaluation    Endoscopies:  None  Imaging:  US Abdomen Complete CLINICAL DATA:  Right upper quadrant abdominal pain  EXAM: ABDOMEN ULTRASOUND COMPLETE  COMPARISON:  None.  FINDINGS: Gallbladder: No gallstones, gallbladder wall thickening, or pericholecystic fluid. Negative sonographic Murphy's sign.  Common bile duct: Diameter: 4 mm  Liver: Incompletely visualized but grossly unremarkable. No focal lesion identified. Within normal limits in parenchymal echogenicity. Portal vein is patent on color Doppler imaging with normal direction of blood flow towards the liver.  IVC: No abnormality visualized.  Pancreas: Poorly visualized due to overlying bowel gas.  Spleen: Size and appearance within normal limits.  Right Kidney: Length: 9.7 cm. Echogenicity within normal limits. No mass or hydronephrosis visualized.  Left Kidney: Length: 9.8 cm. Echogenicity within normal limits. No mass or hydronephrosis visualized.  Abdominal aorta: No aneurysm visualized.  Other findings: None.  IMPRESSION: Unremarkable abdominal ultrasound.  Electronically Signed   By: Julian Hy M.D.   On: 08/25/2020 08:46    Past Medical History:  Diagnosis Date   Bradycardia    Glaucoma    Hyperlipidemia    Hypertension    Hypoglycemia    Lightheadedness    Past Surgical History:  Procedure Laterality Date   EYE SURGERY     NO PAST SURGERIES     Family History  Problem Relation Age of Onset   Cataracts Mother    Social History   Tobacco Use   Smoking status: Never   Smokeless tobacco: Never  Vaping Use   Vaping Use: Never used  Substance Use Topics   Alcohol use: Not Currently   Current Outpatient Medications  Medication Sig  Dispense Refill   amLODipine (NORVASC) 2.5 MG tablet Take 2.5 mg by mouth daily.     ASPIRIN 81 PO Take 81 mg by mouth daily.      pravastatin (PRAVACHOL) 10 MG tablet Take 10 mg by mouth daily.     cetirizine (ZYRTEC) 10 MG tablet Take 10 mg by mouth daily. (Patient not taking: Reported on 01/28/2023)     No current facility-administered medications for this visit.   No Known Allergies  Review of Systems: All other systems reviewed and negative except where noted in HPI.    Physical Exam    Wt Readings from Last 3 Encounters:  01/28/23 132 lb (59.9 kg)  02/05/22 140 lb 0.6 oz (63.5 kg)  05/02/21 138 lb (62.6 kg)    BP 112/67   Pulse 83   Ht 5\' 3"  (1.6 m)   Wt 132 lb (59.9 kg)   BMI 23.38 kg/m  Constitutional:  Generally well appearing male in no acute distress. HEENT: Pupils normal.  Conjunctivae are normal. No scleral icterus. No oral lesions or deformities noted.  Neck: Supple.  Cardiac: Normal rate, regular rhythm without murmurs. Pulmonary/chest: Effort normal and breath sounds normal. No wheezing, rales or rhonchi. Abdominal: Soft, nondistended, nontender. Active bowel sounds. No palpable HSM, masses or hernias. Rectal: Small external hemorrhoidal tag no internal lesions, firm prostate, Hemoccult negative mucus in the vault Extremities: No edema or deformities noted Neurological: Alert and oriented to person, place and time. Psychiatric: Pleasant. Normal mood and affect. Behavior is normal. Skin: Skin is warm and dry. No rashes noted.  Lucio Edward, MD   cc:  Referring Provider Hayden Rasmussen, MD

## 2023-01-28 NOTE — Patient Instructions (Signed)
Your provider has requested that you go to the basement level for lab work before leaving today. Press "B" on the elevator. The lab is located at the first door on the left as you exit the elevator.  You have been scheduled for a CT scan of the abdomen and pelvis at [facility], 1st floor Radiology. You are scheduled on 02/05/23 at 3:30pm. Please arrive at 1:15 pm for the scan.  Please follow the written instructions below on the day of your exam:   1) Do not eat anything after 11:30am (4 hours prior to your test)    The purpose of you drinking the oral contrast is to aid in the visualization of your intestinal tract. The contrast solution may cause some diarrhea. Depending on your individual set of symptoms, you may also receive an intravenous injection of x-ray contrast/dye. Plan on being at [facility] for 45 minutes or longer, depending on the type of exam you are having performed.   If you have any questions regarding your exam or if you need to reschedule, you may call [facility] at [phone] between the hours of 8:00 am and 5:00 pm, Monday-Friday.   It has been recommended to you by your physician that you have a(n) colonoscopy completed. Per your request, we did not schedule the procedure(s) today. Please contact our office at [phone] should you decide to have the procedure completed. You will be scheduled for a pre-visit and procedure at that time.  The  GI providers would like to encourage you to use [facility] to communicate with providers for non-urgent requests or questions.  Due to long hold times on the telephone, sending your provider a message by [facility] may be a faster and more efficient way to get a response.  Please allow 48 business hours for a response.  Please remember that this is for non-urgent requests.   Due to recent changes in healthcare laws, you may see the results of your imaging and laboratory studies on MyChart before your provider has  had a chance to review them.  We understand that in some cases there may be results that are confusing or concerning to you. Not all laboratory results come back in the same time frame and the provider may be waiting for multiple results in order to interpret others.  Please give [place] 48 hours in order for your provider to thoroughly review all the results before contacting the office for clarification of your results.   Thank you for choosing me and [facility].  Pricilla Riffle. Dagoberto Ligas., MD., Marval Regal

## 2023-02-05 ENCOUNTER — Ambulatory Visit (HOSPITAL_COMMUNITY): Payer: Medicare Other

## 2023-04-23 ENCOUNTER — Ambulatory Visit (HOSPITAL_COMMUNITY)
Admission: RE | Admit: 2023-04-23 | Discharge: 2023-04-23 | Disposition: A | Discharge status: Home / Self Care | Payer: Medicare Other | Source: Ambulatory Visit | Attending: Gastroenterology | Admitting: Gastroenterology

## 2023-04-23 DIAGNOSIS — K642 Third degree hemorrhoids: Secondary | ICD-10-CM | POA: Insufficient documentation

## 2023-04-23 DIAGNOSIS — R634 Abnormal weight loss: Secondary | ICD-10-CM | POA: Diagnosis present

## 2023-04-23 DIAGNOSIS — K5900 Constipation, unspecified: Secondary | ICD-10-CM | POA: Diagnosis not present

## 2023-04-23 MED ORDER — IOHEXOL 9 MG/ML PO SOLN
ORAL | Status: AC
  Filled 2023-04-23: qty 1000

## 2023-04-23 MED ORDER — SODIUM CHLORIDE (PF) 0.9 % IJ SOLN
INTRAMUSCULAR | Status: AC
  Filled 2023-04-23: qty 50

## 2023-04-23 MED ORDER — IOHEXOL 9 MG/ML PO SOLN
500.0000 mL | ORAL | Status: AC
  Administered 2023-04-23: 1000 mL via ORAL

## 2023-04-23 MED ORDER — IOHEXOL 300 MG/ML  SOLN
100.0000 mL | Freq: Once | INTRAMUSCULAR | Status: AC | PRN
  Administered 2023-04-23: 100 mL via INTRAVENOUS

## 2023-08-01 ENCOUNTER — Ambulatory Visit: Payer: Medicare Other | Admitting: Cardiovascular Disease

## 2023-11-04 ENCOUNTER — Encounter (INDEPENDENT_AMBULATORY_CARE_PROVIDER_SITE_OTHER): Payer: Self-pay | Admitting: Otolaryngology

## 2024-01-15 ENCOUNTER — Telehealth: Payer: Self-pay | Admitting: Cardiovascular Disease

## 2024-01-15 ENCOUNTER — Ambulatory Visit: Payer: Medicare HMO | Attending: Internal Medicine | Admitting: Internal Medicine

## 2024-01-15 VITALS — BP 126/64 | HR 70 | Ht 64.0 in | Wt 133.0 lb

## 2024-01-15 DIAGNOSIS — R079 Chest pain, unspecified: Secondary | ICD-10-CM

## 2024-01-15 NOTE — Telephone Encounter (Signed)
   Pt c/o of Chest Pain: STAT if active CP, including tightness, pressure, jaw pain, radiating pain to shoulder/upper arm/back, CP unrelieved by Nitro. Symptoms reported of SOB, nausea, vomiting, sweating.  1. Are you having CP right now? Yes    2. Are you experiencing any other symptoms (ex. SOB, nausea, vomiting, sweating)? No   3. Is your CP continuous or coming and going? Come and go   4. Have you taken Nitroglycerin? No   5. How long have you been experiencing CP? Yesterday    6. If NO CP at time of call then end call with telling Pt to call back or call 911 if Chest pain returns prior to return call from triage team. Call transferred

## 2024-01-15 NOTE — Telephone Encounter (Signed)
Patient identification verified by 2 forms. Marilynn Rail, RN    Received call from patients wife Naidhuong Naidhuong states:   -patient was having chest pain last night and this morning   -chest pain resolved on its own   -when chest pain occurs it lasts a few seconds   -first time patient is experiencing chest pain recently  Naidhuong denies:   -pain radiating down arm/jaw   -SOB with chest pain  Informed Naidhuong since patient is not having active chest pain, can schedule OV for today at 3:40pm. If chest pain returns or worsens go to the ED, do not wait for appointment.  Naidhuong verbalized understanding, no questions at this time.

## 2024-01-15 NOTE — Patient Instructions (Addendum)
Medication Instructions:  No changes *If you need a refill on your cardiac medications before your next appointment, please call your pharmacy*   Lab Work: None  If you have labs (blood work) drawn today and your tests are completely normal, you will receive your results only by: MyChart Message (if you have MyChart) OR A paper copy in the mail If you have any lab test that is abnormal or we need to change your treatment, we will call you to review the results.   Testing/Procedures: Exercise Myoview  Your physician has requested that you have en exercise stress myoview. For further information please visit [email or web address] Please follow instruction sheet, as given.     Follow-Up: At [facility], you and your health needs are our priority.  As part of our continuing mission to provide you with exceptional heart care, we have created designated Provider Care Teams.  These Care Teams include your primary Cardiologist (physician) and Advanced Practice Providers (APPs -  Physician Assistants and Nurse Practitioners) who all work together to provide you with the care you need, when you need it.    Your next appointment:   Has an appointment scheduled   Provider:   Nanetta Batty, MD     Other Instructions

## 2024-01-15 NOTE — Progress Notes (Signed)
  Cardiology Office Note:  .   Date:  01/15/2024  ID:  Shaun Sharp, DOB Feb 02, 1946, MRN [id number] PCP: Dois Davenport, MD  [facility] Providers Cardiologist:  Nanetta Batty, MD    History of Present Illness: .   Shaun Sharp is a 78 y.o. male who was a patient of Dr. Gery Pray here for an acute visit.  He was self-referred in 2023 for sinus bradycardia, hypotension and weakness.  He did have a cardiac monitor that did not show any significant bradycardia arrhythmias.  He is now here today with chest pain.  HPI with the help of AI He noted left side of the chest last night that initially occurred  while going to bed and lasted for two to three seconds before resolving. The pain recurred this morning around 8 AM. The patient denies any previous episodes of chest pain. He had a stress test 'long time ago' which was reportedly normal. He denies any history of smoking or diabetes. The patient's father had a stroke, but also had a significant history of alcohol consumption. The patient was not physically active yesterday, but did have a 4-hour sedentary period for a continuing education course.       ROS:  per HPI otherwise negative   Studies Reviewed: Marland Kitchen        EKG Interpretation Date/Time:  Thursday January 15 2024 15:53:05 EST Ventricular Rate:  70 PR Interval:  156 QRS Duration:  70 QT Interval:  378 QTC Calculation: 408 R Axis:   2  Text Interpretation: Normal sinus rhythm Normal ECG Confirmed by Carolan Clines ([id number]) on 01/15/2024 4:00:28 PM  Risk Assessment/Calculations:        Physical Exam:   VS:  Vitals:   01/15/24 1544  BP: 126/64  Pulse: 70  SpO2: 98%    Wt Readings from Last 3 Encounters:  01/28/23 132 lb (59.9 kg)  02/05/22 140 lb 0.6 oz (63.5 kg)  05/02/21 138 lb (62.6 kg)    GEN: Well nourished, well developed in no acute distress NECK: No JVD;  CARDIAC: RRR, no murmurs, rubs, gallops RESPIRATORY:  Clear to auscultation without rales, wheezing or rhonchi   ABDOMEN: Soft, non-tender, non-distended EXTREMITIES:  No edema; No deformity   ASSESSMENT AND PLAN: .   Assessment and Plan    Possible cardiac chest pain New onset, brief episodes of left-sided chest pain at rest. Normal EKG today. No known history of coronary artery disease. Family history of stroke in father. His CVD risk includes age -Order exercise myocardial perfusion imaging (MPI) stress test, with instructions to change to [facility] if needed. -Advise patient to take it easy until the stress test is completed. -If chest pain worsens, patient is advised to go to the hospital.         Informed Consent   Shared Decision Making/Informed Consent The risks [chest pain, shortness of breath, cardiac arrhythmias, dizziness, blood pressure fluctuations, myocardial infarction, stroke/transient ischemic attack, nausea, vomiting, allergic reaction, radiation exposure, metallic taste sensation and life-threatening complications (estimated to be 1 in 10,000)], benefits (risk stratification, diagnosing coronary artery disease, treatment guidance) and alternatives of a nuclear stress test were discussed in detail with Shaun Sharp and he agrees to proceed.     Dispo: He has a follow-up scheduled Dr. Allyson Sabal in February  Signed, Wyline Mood Alben Spittle, MD

## 2024-01-16 ENCOUNTER — Encounter (HOSPITAL_COMMUNITY): Payer: Self-pay

## 2024-01-19 LAB — EXTERNAL GENERIC LAB PROCEDURE: COLOGUARD: NEGATIVE

## 2024-01-19 LAB — COLOGUARD: COLOGUARD: NEGATIVE

## 2024-01-21 ENCOUNTER — Telehealth (HOSPITAL_COMMUNITY): Payer: Self-pay | Admitting: *Deleted

## 2024-01-21 NOTE — Telephone Encounter (Signed)
I was not able to reach patient by his listed phone number in his chart. I also sent him a message through my chart but he has not yet responded.

## 2024-01-23 ENCOUNTER — Encounter (HOSPITAL_COMMUNITY): Payer: Medicare HMO

## 2024-02-18 ENCOUNTER — Ambulatory Visit: Payer: Medicare HMO | Admitting: Cardiovascular Disease

## 2024-07-26 ENCOUNTER — Ambulatory Visit: Attending: Cardiovascular Disease | Admitting: Cardiovascular Disease

## 2024-09-20 ENCOUNTER — Encounter: Payer: Self-pay | Admitting: Cardiovascular Disease

## 2024-09-20 ENCOUNTER — Ambulatory Visit: Attending: Cardiovascular Disease | Admitting: Cardiovascular Disease

## 2024-09-20 VITALS — BP 128/78 | HR 78 | Ht 64.0 in | Wt 128.0 lb

## 2024-09-20 DIAGNOSIS — I950 Idiopathic hypotension: Secondary | ICD-10-CM | POA: Diagnosis not present

## 2024-09-20 DIAGNOSIS — E782 Mixed hyperlipidemia: Secondary | ICD-10-CM

## 2024-09-20 DIAGNOSIS — R001 Bradycardia, unspecified: Secondary | ICD-10-CM

## 2024-09-20 NOTE — Assessment & Plan Note (Signed)
 History of hyperlipidemia on low-dose pravastatin with lipid profile performed by his PCP 08/07/2024 revealing total cholesterol 189, LDL of 99 and HDL 74.

## 2024-09-20 NOTE — Progress Notes (Signed)
 09/20/2024 Shaun Sharp   1946-06-09  [id number]  Primary Physician Burney Darice CROME, MD Primary Cardiologist: Dorn JINNY Lesches MD [facility] [place], [place]  HPI:  Shaun Sharp is a 78 y.o.    thin appearing married Asian male father of 4 children. I take care of his wife and all his children as well. He was self referred for bradycardia hypotension and weakness. I last saw him  in the office 02/05/2022 he was initially hospitalized on the telemetry medicine teaching service in July for the symptoms. His glaucoma eye drops containing off agonists were discontinued and he feels clinically improved. He has no cardiac risk factors other than 2 hypertension and hyperlipidemia. He does not smoke. There is no family history.   He was in [place]  in early August 2021 and apparently got presyncopal while standing in line at [employer]  He was taken to the emergency room however the work-up was unrevealing.  He has had no recurrent symptoms.     Since I saw him in the office 2-1/2 years ago his remained stable.  He did see Dr. Ronal Ross in the office in January 2025 with some atypical chest pain which has not recurred.  A stress test was ordered but was never done.  He currently denies chest pain or shortness of breath.  His major complaint is weakness.   Current Meds  Medication Sig   amLODipine (NORVASC) 2.5 MG tablet Take 2.5 mg by mouth daily.   ASPIRIN 81 PO Take 81 mg by mouth daily.   cetirizine (ZYRTEC) 10 MG tablet Take 10 mg by mouth daily.   pravastatin (PRAVACHOL) 10 MG tablet Take 10 mg by mouth daily.     No Known Allergies  Social History   Socioeconomic History   Marital status: Married    Spouse name: Not on file   Number of children: 4   Years of education: Not on file   Highest education level: Not on file  Occupational History   Occupation: self employed   Occupation: retired  Tobacco Use   Smoking status: Never   Smokeless tobacco: Never  Vaping Use   Vaping  status: Never Used  Substance and Sexual Activity   Alcohol use: Not Currently   Drug use: Not on file   Sexual activity: Not on file  Other Topics Concern   Not on file  Social History Narrative   Not on file   Social Drivers of Health   Financial Resource Strain: Not on file  Food Insecurity: Not on file  Transportation Needs: Not on file  Physical Activity: Not on file  Stress: Not on file  Social Connections: Unknown (05/17/2023)   Received from [facility] Short Social Needs Screening - Social Connection    Would you like help with any of the following needs: food, medicine/medical supplies, transportation, loneliness, housing or utilities?: Not on file  Intimate Partner Violence: Unknown (04/05/2022)   Received from [facility]   HITS    Physically Hurt: Not on file    Insult or Talk Down To: Not on file    Threaten Physical Harm: Not on file    Scream or Curse: Not on file     Review of Systems: General: negative for chills, fever, night sweats or weight changes.  Cardiovascular: negative for chest pain, dyspnea on exertion, edema, orthopnea, palpitations, paroxysmal nocturnal dyspnea or shortness of breath Dermatological: negative for rash Respiratory: negative for cough or wheezing Urologic: negative  for hematuria Abdominal: negative for nausea, vomiting, diarrhea, bright red blood per rectum, melena, or hematemesis Neurologic: negative for visual changes, syncope, or dizziness All other systems reviewed and are otherwise negative except as noted above.    Blood pressure 128/78, pulse 78, height 5' 4 (1.626 m), weight 128 lb (58.1 kg), SpO2 98%.  General appearance: alert and no distress Neck: no adenopathy, no carotid bruit, no JVD, supple, symmetrical, trachea midline, and thyroid  not enlarged, symmetric, no tenderness/mass/nodules Lungs: clear to auscultation bilaterally Heart: regular rate and rhythm, S1, S2 normal, no murmur, click, rub or  gallop Extremities: extremities normal, atraumatic, no cyanosis or edema Pulses: 2+ and symmetric Skin: Skin color, texture, turgor normal. No rashes or lesions Neurologic: Grossly normal  EKG EKG Interpretation Date/Time:  Monday September 20 2024 10:30:07 EDT Ventricular Rate:  78 PR Interval:  150 QRS Duration:  68 QT Interval:  360 QTC Calculation: 410 R Axis:   36  Text Interpretation: Normal sinus rhythm Normal ECG When compared with ECG of 15-Jan-2024 15:53, No significant change was found Confirmed by Court Carrier [phone]) on 09/20/2024 10:32:05 AM    ASSESSMENT AND PLAN:   Hyperlipidemia History of hyperlipidemia on low-dose pravastatin with lipid profile performed by his PCP 08/07/2024 revealing total cholesterol 189, LDL of 99 and HDL 74.     Carrier DOROTHA Court MD [facility], [facility] 09/20/2024 10:46 AM

## 2024-09-20 NOTE — Patient Instructions (Signed)
